# Patient Record
Sex: Female | Born: 1990 | Race: Black or African American | Hispanic: No | Marital: Single | Smoking: Never smoker
Health system: Southern US, Community
[De-identification: ages and names within clinical notes are randomized; demographics above are authoritative.]

## PROBLEM LIST (undated history)

## (undated) DIAGNOSIS — J40 Bronchitis, not specified as acute or chronic: Secondary | ICD-10-CM

## (undated) DIAGNOSIS — K859 Acute pancreatitis without necrosis or infection, unspecified: Secondary | ICD-10-CM

## (undated) HISTORY — PX: OVARIAN CYST SURGERY: SHX726

---

## 2012-02-22 ENCOUNTER — Emergency Department (HOSPITAL_COMMUNITY)
Admission: EM | Admit: 2012-02-22 | Discharge: 2012-02-22 | Disposition: A | Discharge status: Home / Self Care | Payer: Medicaid Other | Attending: Emergency Medicine | Admitting: Emergency Medicine

## 2012-02-22 ENCOUNTER — Emergency Department (HOSPITAL_COMMUNITY): Payer: Medicaid Other

## 2012-02-22 ENCOUNTER — Encounter (HOSPITAL_COMMUNITY): Payer: Self-pay | Admitting: *Deleted

## 2012-02-22 DIAGNOSIS — Z3202 Encounter for pregnancy test, result negative: Secondary | ICD-10-CM | POA: Insufficient documentation

## 2012-02-22 DIAGNOSIS — R079 Chest pain, unspecified: Secondary | ICD-10-CM | POA: Insufficient documentation

## 2012-02-22 DIAGNOSIS — J029 Acute pharyngitis, unspecified: Secondary | ICD-10-CM | POA: Insufficient documentation

## 2012-02-22 DIAGNOSIS — J069 Acute upper respiratory infection, unspecified: Secondary | ICD-10-CM

## 2012-02-22 LAB — URINE MICROSCOPIC-ADD ON

## 2012-02-22 LAB — URINALYSIS, ROUTINE W REFLEX MICROSCOPIC
Ketones, ur: 15 mg/dL — AB
Protein, ur: 100 mg/dL — AB
Urobilinogen, UA: 1 mg/dL (ref 0.0–1.0)

## 2012-02-22 MED ORDER — GUAIFENESIN ER 1200 MG PO TB12: 1.0000 | ORAL_TABLET | Freq: Two times a day (BID) | ORAL | Status: DC

## 2012-02-22 MED ORDER — IBUPROFEN 800 MG PO TABS: 800.0000 mg | ORAL_TABLET | Freq: Three times a day (TID) | ORAL | Status: DC | PRN

## 2012-02-22 MED ORDER — AMOXICILLIN 875 MG PO TABS: 875.0000 mg | ORAL_TABLET | Freq: Two times a day (BID) | ORAL | Status: DC

## 2012-02-22 NOTE — ED Notes (Signed)
Strep swallow screen done and being sent to lab at this time.

## 2012-02-22 NOTE — ED Notes (Signed)
To x-ray

## 2012-02-22 NOTE — ED Notes (Signed)
C/o flu like sx. Lists: sore throat, whole neck hurts, R ear ache, chest hurts, some dizziness & fever. Onset of sx 2d ago. Pt alert, NAD, calm, interactive, resps e/u, speaking in clear complete sentences. (denies: cough, congestion, cold sx or nvd), also reports heavy bleeding onset 3-4d ago. LMP 01/20/12. Took advil yesterday. Also tried chloraseptic spray. Last ate last night. Last BM yesterday.

## 2012-02-22 NOTE — ED Notes (Signed)
POCT-Preg was ran with a NEGATIVE (-) result.

## 2012-02-22 NOTE — ED Provider Notes (Signed)
History     CSN: [id number]  Arrival date & time 02/22/12  0540   First MD Initiated Contact with Patient 02/22/12 0559      Chief Complaint  Patient presents with  . Influenza    (Consider location/radiation/quality/duration/timing/severity/associated sxs/prior treatment) HPI The patient presents to the ER with sore throat, ear pain, and fever. The patient states that her symptoms started 3 days ago. The patient has had constant sharp knife like chest pain that will wax and wane in intensity. The patient states that she did not take anything prior to arrival for her symptoms. The patient denies SOB, headache, visual change, calf pain, weakness, dizziness, vomiting, nausea, abdominal pain, back pain, or syncope. The patient states that nothing seems to make her condition better or worse.  History reviewed. No pertinent past medical history.  Past Surgical History  Procedure Date  . Ovarian cyst surgery     No family history on file.  History  Substance Use Topics  . Smoking status: Never Smoker   . Smokeless tobacco: Not on file  . Alcohol Use: No    OB History    Grav Para Term Preterm Abortions TAB SAB Ect Mult Living                  Review of Systems All other systems negative except as documented in the HPI. All pertinent positives and negatives as reviewed in the HPI. Allergies  Review of patient's allergies indicates no known allergies.  Home Medications   Current Outpatient Rx  Name  Route  Sig  Dispense  Refill  . ACETAMINOPHEN 500 MG PO TABS   Oral   Take 1,000 mg by mouth every 6 (six) hours as needed. For pain           BP 130/71  Temp 100.9 F (38.3 C) (Oral)  Resp 18  SpO2 100%  LMP 02/19/2012  Physical Exam  Nursing note and vitals reviewed. Constitutional: She is oriented to person, place, and time. She appears well-developed and well-nourished.  Non-toxic appearance. She does not have a sickly appearance. She does not appear ill. No  distress.  HENT:  Head: Normocephalic and atraumatic.  Mouth/Throat: Uvula is midline and mucous membranes are normal. No uvula swelling. Posterior oropharyngeal erythema present. No oropharyngeal exudate.  Eyes: Pupils are equal, round, and reactive to light.  Neck: Normal range of motion. Neck supple.  Cardiovascular: Normal rate, regular rhythm and normal heart sounds.   No murmur heard. Pulmonary/Chest: Effort normal and breath sounds normal. No respiratory distress.  Abdominal: Soft. Bowel sounds are normal. She exhibits no distension. There is no tenderness.  Neurological: She is alert and oriented to person, place, and time.  Skin: Skin is warm and dry. No rash noted.    ED Course  Procedures (including critical care time)  Labs Reviewed  URINALYSIS, ROUTINE W REFLEX MICROSCOPIC - Abnormal; Notable for the following:    Color, Urine RED (*)  BIOCHEMICALS MAY BE AFFECTED BY COLOR   APPearance CLOUDY (*)     Hgb urine dipstick LARGE (*)     Bilirubin Urine SMALL (*)     Ketones, ur 15 (*)     Protein, ur 100 (*)     Leukocytes, UA SMALL (*)     All other components within normal limits  URINE MICROSCOPIC-ADD ON - Abnormal; Notable for the following:    Squamous Epithelial / LPF MANY (*)     Bacteria, UA MANY (*)  All other components within normal limits  POCT PREGNANCY, URINE  POCT PREGNANCY, URINE  RAPID STREP SCREEN  URINE CULTURE   06:15 AM Patient is advised of the expected course here in the ER. Asked if she had any questions at this time and she denied having any. Patient is stable at this time.    The patient will be discharged with flu like illness. The patient appears in no distress. The patient advised to rest and increase her fluid intake. Return here as needed. MDM          Carlyle Dolly, PA-C 02/24/12 [phone]

## 2012-02-22 NOTE — ED Notes (Signed)
ED PA at BS 

## 2012-02-23 LAB — URINE CULTURE

## 2012-02-25 NOTE — ED Provider Notes (Signed)
Medical screening examination/treatment/procedure(s) were performed by non-physician practitioner and as supervising physician I was immediately available for consultation/collaboration.   Jeymi Hepp, MD 02/25/12 0701 

## 2012-03-02 ENCOUNTER — Emergency Department (HOSPITAL_COMMUNITY): Payer: Medicaid Other

## 2012-03-02 ENCOUNTER — Emergency Department (HOSPITAL_COMMUNITY)
Admission: EM | Admit: 2012-03-02 | Discharge: 2012-03-02 | Disposition: A | Discharge status: Home / Self Care | Payer: Medicaid Other | Attending: Emergency Medicine | Admitting: Emergency Medicine

## 2012-03-02 ENCOUNTER — Encounter (HOSPITAL_COMMUNITY): Payer: Self-pay | Admitting: Emergency Medicine

## 2012-03-02 DIAGNOSIS — R5381 Other malaise: Secondary | ICD-10-CM | POA: Diagnosis not present

## 2012-03-02 DIAGNOSIS — R197 Diarrhea, unspecified: Secondary | ICD-10-CM | POA: Insufficient documentation

## 2012-03-02 DIAGNOSIS — R51 Headache: Secondary | ICD-10-CM | POA: Insufficient documentation

## 2012-03-02 DIAGNOSIS — Z8709 Personal history of other diseases of the respiratory system: Secondary | ICD-10-CM | POA: Insufficient documentation

## 2012-03-02 DIAGNOSIS — R110 Nausea: Secondary | ICD-10-CM | POA: Diagnosis not present

## 2012-03-02 DIAGNOSIS — M542 Cervicalgia: Secondary | ICD-10-CM | POA: Diagnosis not present

## 2012-03-02 DIAGNOSIS — K5900 Constipation, unspecified: Secondary | ICD-10-CM | POA: Diagnosis not present

## 2012-03-02 DIAGNOSIS — R42 Dizziness and giddiness: Secondary | ICD-10-CM | POA: Diagnosis not present

## 2012-03-02 DIAGNOSIS — N898 Other specified noninflammatory disorders of vagina: Secondary | ICD-10-CM | POA: Insufficient documentation

## 2012-03-02 DIAGNOSIS — R350 Frequency of micturition: Secondary | ICD-10-CM | POA: Insufficient documentation

## 2012-03-02 DIAGNOSIS — N739 Female pelvic inflammatory disease, unspecified: Secondary | ICD-10-CM

## 2012-03-02 DIAGNOSIS — R1032 Left lower quadrant pain: Secondary | ICD-10-CM | POA: Diagnosis present

## 2012-03-02 DIAGNOSIS — Z3202 Encounter for pregnancy test, result negative: Secondary | ICD-10-CM | POA: Insufficient documentation

## 2012-03-02 DIAGNOSIS — R509 Fever, unspecified: Secondary | ICD-10-CM | POA: Insufficient documentation

## 2012-03-02 DIAGNOSIS — R52 Pain, unspecified: Secondary | ICD-10-CM | POA: Insufficient documentation

## 2012-03-02 DIAGNOSIS — R3915 Urgency of urination: Secondary | ICD-10-CM | POA: Insufficient documentation

## 2012-03-02 HISTORY — DX: Bronchitis, not specified as acute or chronic: J40

## 2012-03-02 LAB — CBC WITH DIFFERENTIAL/PLATELET
Basophils Relative: 0 % (ref 0–1)
Eosinophils Absolute: 0.1 10*3/uL (ref 0.0–0.7)
Eosinophils Relative: 1 % (ref 0–5)
Hemoglobin: 12.1 g/dL (ref 12.0–15.0)
MCH: 26.4 pg (ref 26.0–34.0)
MCHC: 33.6 g/dL (ref 30.0–36.0)
MCV: 78.6 fL (ref 78.0–100.0)
Monocytes Relative: 9 % (ref 3–12)
Neutrophils Relative %: 81 % — ABNORMAL HIGH (ref 43–77)

## 2012-03-02 LAB — COMPREHENSIVE METABOLIC PANEL
ALT: 8 U/L (ref 0–35)
BUN: 9 mg/dL (ref 6–23)
CO2: 26 mEq/L (ref 19–32)
Calcium: 9.1 mg/dL (ref 8.4–10.5)
Creatinine, Ser: 0.73 mg/dL (ref 0.50–1.10)
GFR calc Af Amer: >90 mL/min (ref >90)
GFR calc non Af Amer: >90 mL/min (ref >90)
Glucose, Bld: 88 mg/dL (ref 70–99)
Sodium: 139 mEq/L (ref 135–145)
Total Protein: 7.8 g/dL (ref 6.0–8.3)

## 2012-03-02 LAB — WET PREP, GENITAL: Yeast Wet Prep HPF POC: NONE SEEN

## 2012-03-02 LAB — URINALYSIS, ROUTINE W REFLEX MICROSCOPIC
Bilirubin Urine: NEGATIVE
Hgb urine dipstick: NEGATIVE
Nitrite: NEGATIVE
Specific Gravity, Urine: 1.014 (ref 1.005–1.030)
pH: 6.5 (ref 5.0–8.0)

## 2012-03-02 LAB — POCT I-STAT, CHEM 8
BUN: 8 mg/dL (ref 6–23)
Chloride: 104 mEq/L (ref 96–112)
HCT: 38 % (ref 36.0–46.0)
Potassium: 3.5 mEq/L (ref 3.5–5.1)
Sodium: 141 mEq/L (ref 135–145)

## 2012-03-02 MED ORDER — DOXYCYCLINE HYCLATE 100 MG PO CAPS: 100.0000 mg | ORAL_CAPSULE | Freq: Two times a day (BID) | ORAL | Status: DC

## 2012-03-02 MED ORDER — SODIUM CHLORIDE 0.9 % IV BOLUS (SEPSIS)
1000.0000 mL | Freq: Once | INTRAVENOUS | Status: AC
  Administered 2012-03-02: 1000 mL via INTRAVENOUS

## 2012-03-02 MED ORDER — OXYCODONE-ACETAMINOPHEN 5-325 MG PO TABS: 1.0000 | ORAL_TABLET | ORAL | Status: DC | PRN

## 2012-03-02 MED ORDER — LIDOCAINE HCL (PF) 1 % IJ SOLN
INTRAMUSCULAR | Status: AC
  Administered 2012-03-02: 1.8 mL via INTRAMUSCULAR
  Filled 2012-03-02: qty 5

## 2012-03-02 MED ORDER — CEFTRIAXONE SODIUM 250 MG IJ SOLR
250.0000 mg | Freq: Once | INTRAMUSCULAR | Status: AC
  Administered 2012-03-02: 250 mg via INTRAMUSCULAR
  Filled 2012-03-02: qty 250

## 2012-03-02 MED ORDER — ONDANSETRON 4 MG PO TBDP
4.0000 mg | ORAL_TABLET | Freq: Once | ORAL | Status: AC
  Administered 2012-03-02: 4 mg via ORAL
  Filled 2012-03-02: qty 1

## 2012-03-02 MED ORDER — IOHEXOL 300 MG/ML  SOLN
20.0000 mL | INTRAMUSCULAR | Status: AC
  Administered 2012-03-02: 25 mL via ORAL

## 2012-03-02 MED ORDER — ACETAMINOPHEN 325 MG PO TABS
650.0000 mg | ORAL_TABLET | Freq: Once | ORAL | Status: AC
  Administered 2012-03-02: 650 mg via ORAL
  Filled 2012-03-02: qty 2

## 2012-03-02 MED ORDER — IOHEXOL 300 MG/ML  SOLN
100.0000 mL | Freq: Once | INTRAMUSCULAR | Status: AC | PRN
  Administered 2012-03-02: 100 mL via INTRAVENOUS

## 2012-03-02 MED ORDER — METRONIDAZOLE 500 MG PO TABS: 500.0000 mg | ORAL_TABLET | Freq: Two times a day (BID) | ORAL | Status: DC

## 2012-03-02 NOTE — ED Provider Notes (Signed)
History     CSN: [id number]  Arrival date & time 03/02/12  4098   First MD Initiated Contact with Patient 03/02/12 [phone]      Chief Complaint  Patient presents with  . Abdominal Pain  . Headache    (Consider location/radiation/quality/duration/timing/severity/associated sxs/prior treatment) HPI Comments: 22 y.o. female presents today complaining of lingering flu-like symptoms: chills, headache, nausea, dizziness, generalized body aches, diarrhea now somewhat constipated. Pt also complaining of severe, dull, LLQ abdominal pain for the past 2 days. Gradually worsening. Persistent. Nothing making it better or worse. Pt was treated here about a week ago for a URI with amoxicillin. Boyfriend had similar symptoms a few weeks ago. Pt also complaining of burning on urination, discharge, and retention. PMHx significant for prior ovarian cysts removed last April.     Past Medical History  Diagnosis Date  . Bronchitis     Past Surgical History  Procedure Laterality Date  . Ovarian cyst surgery      No family history on file.  History  Substance Use Topics  . Smoking status: Never Smoker   . Smokeless tobacco: Not on file  . Alcohol Use: No    OB History   Grav Para Term Preterm Abortions TAB SAB Ect Mult Living                  Review of Systems  Constitutional: Positive for fever and chills. Negative for diaphoresis.  HENT: Positive for neck pain. Negative for ear pain, congestion, sore throat, rhinorrhea, sneezing, neck stiffness and sinus pressure.   Eyes: Negative for visual disturbance.  Respiratory: Negative for apnea, chest tightness and shortness of breath.   Cardiovascular: Negative for chest pain and palpitations.  Gastrointestinal: Positive for nausea, abdominal pain, diarrhea and constipation. Negative for vomiting and abdominal distention.       LLQ pain  Genitourinary: Positive for urgency, frequency and vaginal discharge. Negative for dysuria and hematuria.   Musculoskeletal: Negative for gait problem.  Skin: Negative for rash.  Neurological: Positive for dizziness, weakness, light-headedness and headaches. Negative for syncope and numbness.    Allergies  Penicillins  Home Medications   Current Outpatient Rx  Name  Route  Sig  Dispense  Refill  . acetaminophen (TYLENOL) 500 MG tablet   Oral   Take 1,000 mg by mouth every 6 (six) hours as needed. For pain         . amoxicillin (AMOXIL) 875 MG tablet   Oral   Take 1 tablet (875 mg total) by mouth 2 (two) times daily.   20 tablet   0   . Guaifenesin 1200 MG TB12   Oral   Take 1 tablet (1,200 mg total) by mouth 2 (two) times daily.   20 each   0   . ibuprofen (ADVIL,MOTRIN) 800 MG tablet   Oral   Take 1 tablet (800 mg total) by mouth every 8 (eight) hours as needed for pain.   21 tablet   0     BP 128/72  Pulse 111  Temp(Src) 100.5 F (38.1 C) (Oral)  Resp 20  SpO2 100%  LMP 02/19/2012  Physical Exam  Nursing note and vitals reviewed. Constitutional: She is oriented to person, place, and time. She appears well-developed and well-nourished.  Pt is sick looking and uncomfortable  HENT:  Head: Normocephalic and atraumatic.  Right Ear: Tympanic membrane normal.  Left Ear: Tympanic membrane normal.  Eyes: Conjunctivae and EOM are normal. Pupils are equal, round, and reactive to  light.  Neck: Normal range of motion. Neck supple.  No meningeal signs  Cardiovascular: Normal rate, regular rhythm and normal heart sounds.  Exam reveals no gallop and no friction rub.   No murmur heard. Pulmonary/Chest: Effort normal and breath sounds normal. No respiratory distress. She has no wheezes. She has no rales. She exhibits no tenderness.  Abdominal: Soft. Bowel sounds are normal. She exhibits no distension. There is tenderness. There is no rebound and no guarding.  LLQ tenderness on palpation.  Genitourinary: Vagina normal.  Pt expressed discomfort during pelvic exam. Adnexal  tenderness left > right. Mild cervical motion tenderness. White cervical discharge.   Musculoskeletal: Normal range of motion. She exhibits no edema and no tenderness.  5/5 strength throughout.  Neurological: She is alert and oriented to person, place, and time. No cranial nerve deficit.  No focal deficits. Sensitivity to light touch intact.  Skin: Skin is warm and dry. She is not diaphoretic. No erythema. There is pallor.    ED Course  Procedures (including critical care time)  Labs Reviewed  GC/CHLAMYDIA PROBE AMP  WET PREP, GENITAL  URINALYSIS, ROUTINE W REFLEX MICROSCOPIC  PREGNANCY, URINE  CBC WITH DIFFERENTIAL  LIPASE, BLOOD  COMPREHENSIVE METABOLIC PANEL   Results for orders placed during the hospital encounter of 03/02/12  WET PREP, GENITAL      Result Value Range   Yeast Wet Prep HPF POC NONE SEEN  NONE SEEN   Trich, Wet Prep NONE SEEN  NONE SEEN   Clue Cells Wet Prep HPF POC FEW (*) NONE SEEN   WBC, Wet Prep HPF POC MANY (*) NONE SEEN  URINALYSIS, ROUTINE W REFLEX MICROSCOPIC      Result Value Range   Color, Urine YELLOW  YELLOW   APPearance CLEAR  CLEAR   Specific Gravity, Urine 1.014  1.005 - 1.030   pH 6.5  5.0 - 8.0   Glucose, UA NEGATIVE  NEGATIVE mg/dL   Hgb urine dipstick NEGATIVE  NEGATIVE   Bilirubin Urine NEGATIVE  NEGATIVE   Ketones, ur NEGATIVE  NEGATIVE mg/dL   Protein, ur NEGATIVE  NEGATIVE mg/dL   Urobilinogen, UA 1.0  0.0 - 1.0 mg/dL   Nitrite NEGATIVE  NEGATIVE   Leukocytes, UA NEGATIVE  NEGATIVE  PREGNANCY, URINE      Result Value Range   Preg Test, Ur NEGATIVE  NEGATIVE  CBC WITH DIFFERENTIAL      Result Value Range   WBC 15.6 (*) 4.0 - 10.5 K/uL   RBC 4.58  3.87 - 5.11 MIL/uL   Hemoglobin 12.1  12.0 - 15.0 g/dL   HCT 01.0  27.2 - 53.6 %   MCV 78.6  78.0 - 100.0 fL   MCH 26.4  26.0 - 34.0 pg   MCHC 33.6  30.0 - 36.0 g/dL   RDW 64.4  03.4 - 74.2 %   Platelets 366  150 - 400 K/uL   Neutrophils Relative 81 (*) 43 - 77 %   Neutro Abs  12.7 (*) 1.7 - 7.7 K/uL   Lymphocytes Relative 9 (*) 12 - 46 %   Lymphs Abs 1.4  0.7 - 4.0 K/uL   Monocytes Relative 9  3 - 12 %   Monocytes Absolute 1.4 (*) 0.1 - 1.0 K/uL   Eosinophils Relative 1  0 - 5 %   Eosinophils Absolute 0.1  0.0 - 0.7 K/uL   Basophils Relative 0  0 - 1 %   Basophils Absolute 0.0  0.0 - 0.1 K/uL  LIPASE, BLOOD      Result Value Range   Lipase 1888 (*) 11 - 59 U/L  COMPREHENSIVE METABOLIC PANEL      Result Value Range   Sodium 139  135 - 145 mEq/L   Potassium 3.5  3.5 - 5.1 mEq/L   Chloride 102  96 - 112 mEq/L   CO2 26  19 - 32 mEq/L   Glucose, Bld 88  70 - 99 mg/dL   BUN 9  6 - 23 mg/dL   Creatinine, Ser 5.78  0.50 - 1.10 mg/dL   Calcium 9.1  8.4 - 46.9 mg/dL   Total Protein 7.8  6.0 - 8.3 g/dL   Albumin 3.6  3.5 - 5.2 g/dL   AST 15  0 - 37 U/L   ALT 8  0 - 35 U/L   Alkaline Phosphatase 58  39 - 117 U/L   Total Bilirubin 0.2 (*) 0.3 - 1.2 mg/dL   GFR calc non Af Amer >90  >90 mL/min   GFR calc Af Amer >90  >90 mL/min  LACTIC ACID, PLASMA      Result Value Range   Lactic Acid, Venous 0.5  0.5 - 2.2 mmol/L  POCT I-STAT, CHEM 8      Result Value Range   Sodium 141  135 - 145 mEq/L   Potassium 3.5  3.5 - 5.1 mEq/L   Chloride 104  96 - 112 mEq/L   BUN 8  6 - 23 mg/dL   Creatinine, Ser 6.29  0.50 - 1.10 mg/dL   Glucose, Bld 92  70 - 99 mg/dL   Calcium, Ion 5.28 (*) 1.12 - 1.23 mmol/L   TCO2 27  0 - 100 mmol/L   Hemoglobin 12.9  12.0 - 15.0 g/dL   HCT 41.3  24.4 - 01.0 %   US Transvaginal Non-ob  03/02/2012  *RADIOLOGY REPORT*  Clinical Data:  Severe pelvic pain, left side greater than right. The clinical suspicion for ovarian torsion.  Previous ovarian cyst removal.  TRANSABDOMINAL AND TRANSVAGINAL ULTRASOUND OF PELVIS DOPPLER ULTRASOUND OF OVARIES  Technique:  Both transabdominal and transvaginal ultrasound examinations of the pelvis were performed. Transabdominal technique was performed for global imaging of the pelvis including uterus, ovaries,  adnexal regions, and pelvic cul-de-sac.  It was necessary to proceed with endovaginal exam following the transabdominal exam to visualize the retroverted uterus and ovaries.  Color and duplex Doppler ultrasound was utilized to evaluate blood flow to the ovaries.  Comparison:  None.  Findings:  Uterus:  6.8 x 3.6 x 4.6 cm.  Retroverted.  No fibroids are other uterine mass identified.  Endometrium:  Double layer thickness measures 5 mm transvaginally. No focal lesion visualized.  Right ovary: 3.2 x 2.3 x 1.9 cm.  A 1 cm hyperechoic lesion is seen in the ovary which does not show acoustic shadowing.  This could represent a small dermoid versus a resolving corpus luteum/albicans.  Left ovary:   3.6 x 1.6 x 1.4 cm.  Normal appearance.  No adnexal mass identified.  Other:  A small amount of free fluid is seen in the pelvic cul-de- sac and both adnexa.  Pulsed Doppler evaluation demonstrates normal low-resistance arterial and venous waveforms in both ovaries.  IMPRESSION:  1.  1 cm hyperechoic lesion in the right ovary, which could represent a small dermoid versus a resolving corpus luteum. Recommend follow-up by pelvic ultrasound in 6-12 weeks. 2.  Small amount of free fluid. 3.  Normal appearance of uterus and left  ovary. 4.  No sonographic evidence for ovarian torsion.   Original Report Authenticated By: Myles Rosenthal, M.D.    US Pelvis Complete  03/02/2012  *RADIOLOGY REPORT*  Clinical Data:  Severe pelvic pain, left side greater than right. The clinical suspicion for ovarian torsion.  Previous ovarian cyst removal.  TRANSABDOMINAL AND TRANSVAGINAL ULTRASOUND OF PELVIS DOPPLER ULTRASOUND OF OVARIES  Technique:  Both transabdominal and transvaginal ultrasound examinations of the pelvis were performed. Transabdominal technique was performed for global imaging of the pelvis including uterus, ovaries, adnexal regions, and pelvic cul-de-sac.  It was necessary to proceed with endovaginal exam following the transabdominal  exam to visualize the retroverted uterus and ovaries.  Color and duplex Doppler ultrasound was utilized to evaluate blood flow to the ovaries.  Comparison:  None.  Findings:  Uterus:  6.8 x 3.6 x 4.6 cm.  Retroverted.  No fibroids are other uterine mass identified.  Endometrium:  Double layer thickness measures 5 mm transvaginally. No focal lesion visualized.  Right ovary: 3.2 x 2.3 x 1.9 cm.  A 1 cm hyperechoic lesion is seen in the ovary which does not show acoustic shadowing.  This could represent a small dermoid versus a resolving corpus luteum/albicans.  Left ovary:   3.6 x 1.6 x 1.4 cm.  Normal appearance.  No adnexal mass identified.  Other:  A small amount of free fluid is seen in the pelvic cul-de- sac and both adnexa.  Pulsed Doppler evaluation demonstrates normal low-resistance arterial and venous waveforms in both ovaries.  IMPRESSION:  1.  1 cm hyperechoic lesion in the right ovary, which could represent a small dermoid versus a resolving corpus luteum. Recommend follow-up by pelvic ultrasound in 6-12 weeks. 2.  Small amount of free fluid. 3.  Normal appearance of uterus and left ovary. 4.  No sonographic evidence for ovarian torsion.   Original Report Authenticated By: Myles Rosenthal, M.D.    Ct Abdomen Pelvis W Contrast  03/02/2012  *RADIOLOGY REPORT*  Clinical Data: Diffuse abdominal pain, nausea, fever  CT ABDOMEN AND PELVIS WITH CONTRAST  Technique:  Multidetector CT imaging of the abdomen and pelvis was performed following the standard protocol during bolus administration of intravenous contrast.  Contrast: OMNIPAQUE IOHEXOL 300 MG/ML  SOLN  Comparison: 03/02/2012 pelvic ultrasound  Findings: Trace dependent left effusion noted with minimal streaky left base atelectasis.  Right lung base clear.  Normal heart size. No pericardial effusion.  Abdomen:  Incidental tiny hypodense 5 mm cyst in the right hepatic lobe posteriorly, image 13.  No other hepatic abnormality.  No biliary dilatation.   Hepatic and portal veins are patent. Gallbladder, biliary system, pancreas, spleen, adrenal glands, and kidneys are within normal limits and demonstrate no acute process for age.  Negative for bowel obstruction, dilatation, ileus pattern, or free air.  No abdominal free fluid, fluid collection, hemorrhage, abscess, or adenopathy.  Pelvis:  Normal appendix in the right lower quadrant.  No acute distal bowel process.  Small to moderate amount of free pelvic fluid surrounding the uterus and adnexa.  Indistinctness of the ovaries bilaterally by CT.  These are better appreciated by ultrasound earlier today.  Small fat density lesion in the left adnexa suspicious for an incidental ovarian dermoid roughly measuring 12 mm, image 69.  Normal sized uterus.  Urinary bladder unremarkable.  No inguinal abnormality or hernia.  IMPRESSION: Trace left effusion and left base atelectasis.  Incidental 5 mm right hepatic cyst  Normal appendix  No acute abdominal process  Small to  moderate amount of pelvic free fluid, nonspecific.  12 mm left ovarian fat density lesion compatible with an incidental small ovarian dermoid.   Original Report Authenticated By: Judie Petit. Miles Costain, M.D.    [place] Art/ven Flow Abd Pelv Doppler Limited  03/02/2012  *RADIOLOGY REPORT*  Clinical Data:  Severe pelvic pain, left side greater than right. The clinical suspicion for ovarian torsion.  Previous ovarian cyst removal.  TRANSABDOMINAL AND TRANSVAGINAL ULTRASOUND OF PELVIS DOPPLER ULTRASOUND OF OVARIES  Technique:  Both transabdominal and transvaginal ultrasound examinations of the pelvis were performed. Transabdominal technique was performed for global imaging of the pelvis including uterus, ovaries, adnexal regions, and pelvic cul-de-sac.  It was necessary to proceed with endovaginal exam following the transabdominal exam to visualize the retroverted uterus and ovaries.  Color and duplex Doppler ultrasound was utilized to evaluate blood flow to the ovaries.   Comparison:  None.  Findings:  Uterus:  6.8 x 3.6 x 4.6 cm.  Retroverted.  No fibroids are other uterine mass identified.  Endometrium:  Double layer thickness measures 5 mm transvaginally. No focal lesion visualized.  Right ovary: 3.2 x 2.3 x 1.9 cm.  A 1 cm hyperechoic lesion is seen in the ovary which does not show acoustic shadowing.  This could represent a small dermoid versus a resolving corpus luteum/albicans.  Left ovary:   3.6 x 1.6 x 1.4 cm.  Normal appearance.  No adnexal mass identified.  Other:  A small amount of free fluid is seen in the pelvic cul-de- sac and both adnexa.  Pulsed Doppler evaluation demonstrates normal low-resistance arterial and venous waveforms in both ovaries.  IMPRESSION:  1.  1 cm hyperechoic lesion in the right ovary, which could represent a small dermoid versus a resolving corpus luteum. Recommend follow-up by pelvic ultrasound in 6-12 weeks. 2.  Small amount of free fluid. 3.  Normal appearance of uterus and left ovary. 4.  No sonographic evidence for ovarian torsion.   Original Report Authenticated By: Myles Rosenthal, M.D.    No results found.   Diagnosis: PID, BV   MDM  Labs show elevated WBC (15.6) and lipase (1888), but imaging and history do not support diagnosis of pancreatitis. Complaining of LLQ tenderness on PE and pelvic exam. Pelvic exam not clear cut to rule out PID. Mild CMT.  Bilateral pelvic pain left > right. [place] and CT show no torsion, incidental findings of dermoid vs corpus luteum. Clue cells found on wet prep. Will treat for PID and BV. Pt feeling significantly better after zofran and IVF and ready to go home.  At this time there does not appear to be any evidence of an acute emergency medical condition and the patient appears stable for discharge with appropriate outpatient follow up. Diagnosis, importance of follow up, compliance with medication, and abstinence from alcohol during medication course was discussed with patient who verbalizes  understanding and is agreeable to discharge. Resource guide provided. Pt followed by Dr. Bebe Shaggy as well who agreed with plan to discharge with outpatient follow up.   Glade Nurse, PA-C 03/02/12 2159

## 2012-03-02 NOTE — ED Provider Notes (Signed)
Pt seen with PA Pt is here for abdominal pain, fever and myalgias She has diffuse lower abdominal tenderness Labs currently pending, but may need [place] or CT imaging for further guidance  Joya Gaskins, MD 03/02/12 [phone]

## 2012-03-02 NOTE — ED Notes (Signed)
Pt reports abdominal pain for 2 days with nausea, malodorous abnormal vaginal discharge and burning with urination. Also reports dizziness and headache as well as neck stiffness. States she was tx approx 1 week ago for URI that had gotten worse. Pt is a&ox4. Skin warm and dry.

## 2012-03-02 NOTE — ED Notes (Signed)
Pt having nausea no vomiting.

## 2012-03-02 NOTE — ED Notes (Signed)
Family at bedside. 

## 2012-03-02 NOTE — ED Notes (Addendum)
Patient advised too hard to drink contrast and may not be able to drink.  "It doesn't taste good".

## 2012-03-03 LAB — GC/CHLAMYDIA PROBE AMP
CT Probe RNA: NEGATIVE
GC Probe RNA: NEGATIVE

## 2012-03-03 NOTE — ED Provider Notes (Signed)
Medical screening examination/treatment/procedure(s) were conducted as a shared visit with non-physician practitioner(s) and myself.  I personally evaluated the patient during the encounter  Pt with unclear history as had RLQ tenderness on my exam, but PA noted LLQ tenderness.  She also had elevated lipase but no signs of pancreatitis by CT imaging.  After OBS in the ED pt was much improved, felt well and she requested d/c home.  I discussed strict return precautions with patient.  She will be treated for PID just in case this is playing a role, however she reports monogamous relationship and no concern for STD  Joya Gaskins, MD 03/03/12 0700

## 2012-03-07 ENCOUNTER — Telehealth (HOSPITAL_COMMUNITY): Payer: Self-pay | Admitting: Emergency Medicine

## 2012-05-05 ENCOUNTER — Emergency Department (HOSPITAL_COMMUNITY)
Admission: EM | Admit: 2012-05-05 | Discharge: 2012-05-05 | Disposition: A | Discharge status: Home / Self Care | Payer: Medicaid Other | Attending: Emergency Medicine | Admitting: Emergency Medicine

## 2012-05-05 ENCOUNTER — Encounter (HOSPITAL_COMMUNITY): Payer: Self-pay | Admitting: Emergency Medicine

## 2012-05-05 DIAGNOSIS — Z3202 Encounter for pregnancy test, result negative: Secondary | ICD-10-CM | POA: Insufficient documentation

## 2012-05-05 DIAGNOSIS — R109 Unspecified abdominal pain: Secondary | ICD-10-CM

## 2012-05-05 DIAGNOSIS — R1013 Epigastric pain: Secondary | ICD-10-CM | POA: Insufficient documentation

## 2012-05-05 DIAGNOSIS — Z8719 Personal history of other diseases of the digestive system: Secondary | ICD-10-CM | POA: Insufficient documentation

## 2012-05-05 DIAGNOSIS — Z79899 Other long term (current) drug therapy: Secondary | ICD-10-CM | POA: Insufficient documentation

## 2012-05-05 DIAGNOSIS — R110 Nausea: Secondary | ICD-10-CM | POA: Insufficient documentation

## 2012-05-05 DIAGNOSIS — Z8709 Personal history of other diseases of the respiratory system: Secondary | ICD-10-CM | POA: Insufficient documentation

## 2012-05-05 HISTORY — DX: Acute pancreatitis without necrosis or infection, unspecified: K85.90

## 2012-05-05 LAB — URINALYSIS, ROUTINE W REFLEX MICROSCOPIC
Glucose, UA: NEGATIVE mg/dL
Nitrite: NEGATIVE
Specific Gravity, Urine: 1.01 (ref 1.005–1.030)
pH: 5.5 (ref 5.0–8.0)

## 2012-05-05 LAB — COMPREHENSIVE METABOLIC PANEL
ALT: 8 U/L (ref 0–35)
Calcium: 9.6 mg/dL (ref 8.4–10.5)
Creatinine, Ser: 1.01 mg/dL (ref 0.50–1.10)
GFR calc Af Amer: >90 mL/min (ref >90)
GFR calc non Af Amer: 80 mL/min — ABNORMAL LOW (ref >90)
Glucose, Bld: 98 mg/dL (ref 70–99)
Sodium: 143 mEq/L (ref 135–145)
Total Protein: 7.5 g/dL (ref 6.0–8.3)

## 2012-05-05 LAB — POCT PREGNANCY, URINE: Preg Test, Ur: NEGATIVE

## 2012-05-05 LAB — CBC WITH DIFFERENTIAL/PLATELET
Basophils Absolute: 0.1 10*3/uL (ref 0.0–0.1)
Eosinophils Absolute: 0.2 10*3/uL (ref 0.0–0.7)
Eosinophils Relative: 2 % (ref 0–5)
HCT: 37.4 % (ref 36.0–46.0)
Lymphs Abs: 2.4 10*3/uL (ref 0.7–4.0)
MCH: 25.9 pg — ABNORMAL LOW (ref 26.0–34.0)
MCV: 77 fL — ABNORMAL LOW (ref 78.0–100.0)
Monocytes Absolute: 1.2 10*3/uL — ABNORMAL HIGH (ref 0.1–1.0)
Platelets: 393 10*3/uL (ref 150–400)
RDW: 14.6 % (ref 11.5–15.5)

## 2012-05-05 LAB — URINE MICROSCOPIC-ADD ON

## 2012-05-05 MED ORDER — PANTOPRAZOLE SODIUM 40 MG IV SOLR
40.0000 mg | Freq: Once | INTRAVENOUS | Status: AC
  Administered 2012-05-05: 40 mg via INTRAVENOUS
  Filled 2012-05-05: qty 40

## 2012-05-05 MED ORDER — GI COCKTAIL ~~LOC~~
30.0000 mL | Freq: Once | ORAL | Status: AC
  Administered 2012-05-05: 30 mL via ORAL
  Filled 2012-05-05: qty 30

## 2012-05-05 MED ORDER — MORPHINE SULFATE 4 MG/ML IJ SOLN
4.0000 mg | Freq: Once | INTRAMUSCULAR | Status: AC
  Administered 2012-05-05: 4 mg via INTRAVENOUS
  Filled 2012-05-05: qty 1

## 2012-05-05 MED ORDER — ONDANSETRON HCL 4 MG/2ML IJ SOLN
4.0000 mg | Freq: Once | INTRAMUSCULAR | Status: AC
  Administered 2012-05-05: 4 mg via INTRAVENOUS
  Filled 2012-05-05: qty 2

## 2012-05-05 MED ORDER — ONDANSETRON 4 MG PO TBDP: 4.0000 mg | ORAL_TABLET | Freq: Three times a day (TID) | ORAL | Status: DC | PRN

## 2012-05-05 MED ORDER — SODIUM CHLORIDE 0.9 % IV BOLUS (SEPSIS)
1000.0000 mL | Freq: Once | INTRAVENOUS | Status: AC
  Administered 2012-05-05: 1000 mL via INTRAVENOUS

## 2012-05-05 MED ORDER — HYDROCODONE-ACETAMINOPHEN 5-325 MG PO TABS: 2.0000 | ORAL_TABLET | ORAL | Status: DC | PRN

## 2012-05-05 NOTE — ED Provider Notes (Signed)
Medical screening examination/treatment/procedure(s) were performed by non-physician practitioner and as supervising physician I was immediately available for consultation/collaboration.  Reford Olliff, MD 05/05/12 1621 

## 2012-05-05 NOTE — ED Notes (Addendum)
Pt was seen 2 months ago and told she had pancreatitis and was told to stop eating fast foods. She was also told at the time that her birth control implant in her arm could be related also and to minimize any drinking. She only drank rarely at the time. She had been seen in the weeks before for PID and was placed on Vicodin and 2 ATB. Doesn't recall names.

## 2012-05-05 NOTE — ED Provider Notes (Signed)
History     CSN: [id number]  Arrival date & time 05/05/12  0554   First MD Initiated Contact with Patient 05/05/12 [phone]      Chief Complaint  Patient presents with  . Abdominal Pain  . Nausea    (Consider location/radiation/quality/duration/timing/severity/associated sxs/prior treatment) HPI Comments: Patient is a 22 year old female who presents with abdominal pain starting last night. The pain is located in her epigastrium and does not radiate. The pain is described as aching and severe. The pain started gradually and progressively worsened since the onset. No alleviating/aggravating factors. The patient has tried nothing for symptoms without relief. Associated symptoms include nausea. Patient denies fever, headache, vomiting, diarrhea, chest pain, SOB, dysuria, constipation, abnormal vaginal bleeding/discharge. Patient reports having the same symptoms 2 months ago when she was told she had pancreatitis. Patient was told it is likely due to her birth control. She has followed up with her OBGYN who did not make any changes to her birth control.     Patient is a 22 y.o. female presenting with abdominal pain.  Abdominal Pain Associated symptoms: nausea     Past Medical History  Diagnosis Date  . Bronchitis   . Pancreatitis     Past Surgical History  Procedure Laterality Date  . Ovarian cyst surgery      No family history on file.  History  Substance Use Topics  . Smoking status: Never Smoker   . Smokeless tobacco: Not on file  . Alcohol Use: No    OB History   Grav Para Term Preterm Abortions TAB SAB Ect Mult Living                  Review of Systems  Gastrointestinal: Positive for nausea and abdominal pain.  All other systems reviewed and are negative.    Allergies  Penicillins  Home Medications   Current Outpatient Rx  Name  Route  Sig  Dispense  Refill  . etonogestrel (IMPLANON) 68 MG IMPL implant   Subcutaneous   Inject 1 each into the skin once.          BP 126/82  Pulse 93  Temp(Src) 98 F (36.7 C) (Oral)  Resp 16  SpO2 100%  Physical Exam  Nursing note and vitals reviewed. Constitutional: She is oriented to person, place, and time. She appears well-developed and well-nourished. No distress.  HENT:  Head: Normocephalic and atraumatic.  Eyes: Conjunctivae and EOM are normal. Pupils are equal, round, and reactive to light.  Neck: Normal range of motion.  Cardiovascular: Normal rate and regular rhythm.  Exam reveals no gallop and no friction rub.   No murmur heard. Pulmonary/Chest: Effort normal and breath sounds normal. She has no wheezes. She has no rales. She exhibits no tenderness.  Abdominal: Soft. She exhibits no distension. There is tenderness. There is no rebound and no guarding.  Epigastric tenderness to palpation.   Musculoskeletal: Normal range of motion.  Neurological: She is alert and oriented to person, place, and time. Coordination normal.  Speech is goal-oriented. Moves limbs without ataxia.   Skin: Skin is warm and dry.  Psychiatric: She has a normal mood and affect. Her behavior is normal.    ED Course  Procedures (including critical care time)  Labs Reviewed  URINALYSIS, ROUTINE W REFLEX MICROSCOPIC - Abnormal; Notable for the following:    Hgb urine dipstick MODERATE (*)    Leukocytes, UA MODERATE (*)    All other components within normal limits  CBC WITH  DIFFERENTIAL - Abnormal; Notable for the following:    WBC 11.9 (*)    MCV 77.0 (*)    MCH 25.9 (*)    Neutro Abs 8.0 (*)    Monocytes Absolute 1.2 (*)    All other components within normal limits  COMPREHENSIVE METABOLIC PANEL - Abnormal; Notable for the following:    Total Bilirubin 0.2 (*)    GFR calc non Af Amer 80 (*)    All other components within normal limits  URINE MICROSCOPIC-ADD ON - Abnormal; Notable for the following:    Bacteria, UA FEW (*)    All other components within normal limits  URINE CULTURE  LIPASE, BLOOD  POCT  PREGNANCY, URINE   No results found.   1. Abdominal pain       MDM  7:14 AM Labs pending. Patient will have morphine and zofran for pain.   8:42 AM Labs unremarkable. Urine pregnancy negative. Urinalysis shows UTI. Lipase not elevate as it previously was. Vitals stable and patient afebrile. Patient likely experiencing gastritis. I will treat her accordingly with IV protonix and GI cocktail and have her discharged with PCP follow up.     Emilia Beck, [place] 05/05/12 [phone]

## 2012-05-05 NOTE — ED Notes (Signed)
Patient is resting comfortably. 

## 2012-05-06 LAB — URINE CULTURE: Colony Count: NO GROWTH

## 2012-05-10 ENCOUNTER — Encounter (HOSPITAL_COMMUNITY): Payer: Self-pay | Admitting: *Deleted

## 2012-05-10 ENCOUNTER — Emergency Department (HOSPITAL_COMMUNITY)
Admission: EM | Admit: 2012-05-10 | Discharge: 2012-05-10 | Disposition: A | Discharge status: Home / Self Care | Payer: Medicaid Other | Attending: Emergency Medicine | Admitting: Emergency Medicine

## 2012-05-10 DIAGNOSIS — R109 Unspecified abdominal pain: Secondary | ICD-10-CM | POA: Insufficient documentation

## 2012-05-10 DIAGNOSIS — K2990 Gastroduodenitis, unspecified, without bleeding: Secondary | ICD-10-CM | POA: Insufficient documentation

## 2012-05-10 DIAGNOSIS — Z8709 Personal history of other diseases of the respiratory system: Secondary | ICD-10-CM | POA: Insufficient documentation

## 2012-05-10 DIAGNOSIS — M545 Low back pain, unspecified: Secondary | ICD-10-CM | POA: Insufficient documentation

## 2012-05-10 DIAGNOSIS — R0602 Shortness of breath: Secondary | ICD-10-CM | POA: Insufficient documentation

## 2012-05-10 DIAGNOSIS — K2970 Gastritis, unspecified, without bleeding: Secondary | ICD-10-CM | POA: Insufficient documentation

## 2012-05-10 DIAGNOSIS — Z3202 Encounter for pregnancy test, result negative: Secondary | ICD-10-CM | POA: Insufficient documentation

## 2012-05-10 DIAGNOSIS — Z79899 Other long term (current) drug therapy: Secondary | ICD-10-CM | POA: Insufficient documentation

## 2012-05-10 DIAGNOSIS — Z8719 Personal history of other diseases of the digestive system: Secondary | ICD-10-CM | POA: Insufficient documentation

## 2012-05-10 DIAGNOSIS — R10819 Abdominal tenderness, unspecified site: Secondary | ICD-10-CM | POA: Insufficient documentation

## 2012-05-10 LAB — CBC WITH DIFFERENTIAL/PLATELET
Hemoglobin: 13.5 g/dL (ref 12.0–15.0)
Lymphs Abs: 1.7 10*3/uL (ref 0.7–4.0)
Monocytes Relative: 7 % (ref 3–12)
Neutro Abs: 4.8 10*3/uL (ref 1.7–7.7)
Neutrophils Relative %: 67 % (ref 43–77)
RBC: 5.16 MIL/uL — ABNORMAL HIGH (ref 3.87–5.11)

## 2012-05-10 LAB — COMPREHENSIVE METABOLIC PANEL
ALT: 9 U/L (ref 0–35)
AST: 16 U/L (ref 0–37)
BUN: 7 mg/dL (ref 6–23)
Calcium: 9.6 mg/dL (ref 8.4–10.5)
Chloride: 103 mEq/L (ref 96–112)
GFR calc non Af Amer: >90 mL/min (ref >90)
Glucose, Bld: 85 mg/dL (ref 70–99)
Potassium: 3.5 mEq/L (ref 3.5–5.1)
Total Protein: 7.9 g/dL (ref 6.0–8.3)

## 2012-05-10 LAB — URINALYSIS, ROUTINE W REFLEX MICROSCOPIC
Glucose, UA: NEGATIVE mg/dL
Protein, ur: NEGATIVE mg/dL
Specific Gravity, Urine: 1.017 (ref 1.005–1.030)
Urobilinogen, UA: 0.2 mg/dL (ref 0.0–1.0)

## 2012-05-10 LAB — URINE MICROSCOPIC-ADD ON

## 2012-05-10 LAB — D-DIMER, QUANTITATIVE: D-Dimer, Quant: <0.27 ug/mL-FEU (ref 0.00–0.48)

## 2012-05-10 LAB — POCT PREGNANCY, URINE: Preg Test, Ur: NEGATIVE

## 2012-05-10 MED ORDER — RANITIDINE HCL 150 MG PO TABS: 150.0000 mg | ORAL_TABLET | Freq: Two times a day (BID) | ORAL | Status: DC

## 2012-05-10 MED ORDER — PROCHLORPERAZINE EDISYLATE 5 MG/ML IJ SOLN: 10.0000 mg | Freq: Once | INTRAMUSCULAR | Status: DC

## 2012-05-10 MED ORDER — DIPHENHYDRAMINE HCL 50 MG/ML IJ SOLN: 25.0000 mg | Freq: Once | INTRAMUSCULAR | Status: DC

## 2012-05-10 MED ORDER — DEXAMETHASONE SODIUM PHOSPHATE 10 MG/ML IJ SOLN: 10.0000 mg | Freq: Once | INTRAMUSCULAR | Status: DC

## 2012-05-10 MED ORDER — KETOROLAC TROMETHAMINE 30 MG/ML IJ SOLN: 30.0000 mg | Freq: Once | INTRAMUSCULAR | Status: DC

## 2012-05-10 MED ORDER — SUCRALFATE 1 GM/10ML PO SUSP: 1.0000 g | Freq: Four times a day (QID) | ORAL | Status: DC

## 2012-05-10 MED ORDER — SODIUM CHLORIDE 0.9 % IV BOLUS (SEPSIS): 500.0000 mL | Freq: Once | INTRAVENOUS | Status: DC

## 2012-05-10 NOTE — ED Notes (Signed)
Pt c/o abd pain, lower back pain, and SOB x's 3-4 days. Pt reports nausea and fever.

## 2012-05-10 NOTE — ED Provider Notes (Signed)
History     CSN: [id number]  Arrival date & time 05/10/12  1750   First MD Initiated Contact with Patient 05/10/12 1932      Chief Complaint  Patient presents with  . Abdominal Pain  . Back Pain    (Consider location/radiation/quality/duration/timing/severity/associated sxs/prior treatment) HPI Comments: Patient comes to the emergency department for evaluation of abdominal pain. Patient was seen 4 days ago at [facility] for similar complaints. Patient has had pain in the upper abdomen that goes into her lower back area. Patient reports recent episode of pancreatitis and symptoms are somewhat similar, but not as severe. Patient thinks he had a fever last AP she was hot and sweaty, could not take her temperature. She has some pain along the left rib anteriorly as well. Intermittently she feels slightly short of breath. She reports a previous history of bronchitis and asthma as a child.  Patient is a 22 y.o. female presenting with abdominal pain and back pain.  Abdominal Pain Associated symptoms: shortness of breath   Back Pain Associated symptoms: abdominal pain     Past Medical History  Diagnosis Date  . Bronchitis   . Pancreatitis     Past Surgical History  Procedure Laterality Date  . Ovarian cyst surgery      History reviewed. No pertinent family history.  History  Substance Use Topics  . Smoking status: Never Smoker   . Smokeless tobacco: Not on file  . Alcohol Use: No    OB History   Grav Para Term Preterm Abortions TAB SAB Ect Mult Living                  Review of Systems  Respiratory: Positive for shortness of breath.   Gastrointestinal: Positive for abdominal pain.  Musculoskeletal: Positive for back pain.  All other systems reviewed and are negative.    Allergies  Penicillins  Home Medications   Current Outpatient Rx  Name  Route  Sig  Dispense  Refill  . HYDROcodone-acetaminophen (NORCO/VICODIN) 5-325 MG per tablet   Oral   Take 2 tablets  by mouth every 4 (four) hours as needed for pain.   6 tablet   0   . loratadine (CLARITIN) 10 MG tablet   Oral   Take 10 mg by mouth daily.         . ondansetron (ZOFRAN ODT) 4 MG disintegrating tablet   Oral   Take 1 tablet (4 mg total) by mouth every 8 (eight) hours as needed for nausea.   10 tablet   0     BP 129/84  Pulse 81  Temp(Src) 98.4 F (36.9 C) (Oral)  Resp 20  SpO2 98%  LMP 04/26/2012  Physical Exam  Constitutional: She is oriented to person, place, and time. She appears well-developed and well-nourished. No distress.  HENT:  Head: Normocephalic and atraumatic.  Right Ear: Hearing normal.  Nose: Nose normal.  Mouth/Throat: Oropharynx is clear and moist and mucous membranes are normal.  Eyes: Conjunctivae and EOM are normal. Pupils are equal, round, and reactive to light.  Neck: Normal range of motion. Neck supple.  Cardiovascular: Normal rate, regular rhythm, S1 normal and S2 normal.  Exam reveals no gallop and no friction rub.   No murmur heard. Pulmonary/Chest: Effort normal and breath sounds normal. No respiratory distress. She exhibits no tenderness.    Abdominal: Soft. Normal appearance and bowel sounds are normal. There is no hepatosplenomegaly. There is tenderness in the epigastric area and left upper  quadrant. There is no rebound, no guarding, no tenderness at McBurney's point and negative Murphy's sign. No hernia.  Musculoskeletal: Normal range of motion.  Neurological: She is alert and oriented to person, place, and time. She has normal strength. No cranial nerve deficit or sensory deficit. Coordination normal. GCS eye subscore is 4. GCS verbal subscore is 5. GCS motor subscore is 6.  Skin: Skin is warm, dry and intact. No rash noted. No cyanosis.  Psychiatric: She has a normal mood and affect. Her speech is normal and behavior is normal. Thought content normal.    ED Course  Procedures (including critical care time)  Labs Reviewed  CBC WITH  DIFFERENTIAL - Abnormal; Notable for the following:    RBC 5.16 (*)    Platelets 431 (*)    All other components within normal limits  URINALYSIS, ROUTINE W REFLEX MICROSCOPIC - Abnormal; Notable for the following:    Leukocytes, UA TRACE (*)    All other components within normal limits  URINE MICROSCOPIC-ADD ON - Abnormal; Notable for the following:    Squamous Epithelial / LPF MANY (*)    All other components within normal limits  COMPREHENSIVE METABOLIC PANEL  LIPASE, BLOOD  D-DIMER, QUANTITATIVE  POCT PREGNANCY, URINE   No results found.   Diagnosis: Abdominal pain    MDM  Patient presents to the ER for evaluation of abdominal pain. Patient was seen at [facility] several days ago for similar symptoms. Workup at that time was unremarkable. Patient does have a history of pancreatitis. Repeat workup today, however, was entirely unremarkable. She has a normal white count and is afebrile. Abdominal exam reveals tenderness in the epigastric region without rectal quadrant tenderness. No lower abdominal tenderness. No concern for acute cholecystitis or appendicitis. Comprehensive metabolic panel was normal. Patient reports that she has had some intermittent shortness of breath. Lungs are clear currently, no bronchospasm or clinical concern for pneumonia. D-dimer was negative. Patient reassured. She was treated for gastritis earlier and will require continued treatment.        Gilda Crease, MD 05/10/12 [phone]

## 2012-12-14 ENCOUNTER — Emergency Department (HOSPITAL_COMMUNITY)
Admission: EM | Admit: 2012-12-14 | Discharge: 2012-12-14 | Disposition: A | Discharge status: Home / Self Care | Payer: Medicaid Other | Attending: Emergency Medicine | Admitting: Emergency Medicine

## 2012-12-14 ENCOUNTER — Encounter (HOSPITAL_COMMUNITY): Payer: Self-pay

## 2012-12-14 DIAGNOSIS — R42 Dizziness and giddiness: Secondary | ICD-10-CM | POA: Insufficient documentation

## 2012-12-14 DIAGNOSIS — J069 Acute upper respiratory infection, unspecified: Secondary | ICD-10-CM

## 2012-12-14 DIAGNOSIS — Z3202 Encounter for pregnancy test, result negative: Secondary | ICD-10-CM | POA: Insufficient documentation

## 2012-12-14 DIAGNOSIS — Z113 Encounter for screening for infections with a predominantly sexual mode of transmission: Secondary | ICD-10-CM | POA: Insufficient documentation

## 2012-12-14 DIAGNOSIS — R509 Fever, unspecified: Secondary | ICD-10-CM | POA: Insufficient documentation

## 2012-12-14 DIAGNOSIS — Z202 Contact with and (suspected) exposure to infections with a predominantly sexual mode of transmission: Secondary | ICD-10-CM

## 2012-12-14 DIAGNOSIS — R51 Headache: Secondary | ICD-10-CM | POA: Insufficient documentation

## 2012-12-14 DIAGNOSIS — K2970 Gastritis, unspecified, without bleeding: Secondary | ICD-10-CM

## 2012-12-14 DIAGNOSIS — J029 Acute pharyngitis, unspecified: Secondary | ICD-10-CM | POA: Insufficient documentation

## 2012-12-14 DIAGNOSIS — Z880 Allergy status to penicillin: Secondary | ICD-10-CM | POA: Insufficient documentation

## 2012-12-14 DIAGNOSIS — H9209 Otalgia, unspecified ear: Secondary | ICD-10-CM | POA: Insufficient documentation

## 2012-12-14 LAB — CBC WITH DIFFERENTIAL/PLATELET
Basophils Absolute: 0 10*3/uL (ref 0.0–0.1)
Basophils Relative: 0 % (ref 0–1)
Eosinophils Absolute: 0.1 10*3/uL (ref 0.0–0.7)
Eosinophils Relative: 1 % (ref 0–5)
HCT: 39.4 % (ref 36.0–46.0)
MCHC: 32.7 g/dL (ref 30.0–36.0)
MCV: 80.2 fL (ref 78.0–100.0)
Monocytes Absolute: 1 10*3/uL (ref 0.1–1.0)
Platelets: 351 10*3/uL (ref 150–400)
RDW: 13.3 % (ref 11.5–15.5)

## 2012-12-14 LAB — COMPREHENSIVE METABOLIC PANEL
AST: 14 U/L (ref 0–37)
Albumin: 4.1 g/dL (ref 3.5–5.2)
Calcium: 9.5 mg/dL (ref 8.4–10.5)
Creatinine, Ser: 0.73 mg/dL (ref 0.50–1.10)
GFR calc non Af Amer: >90 mL/min (ref >90)
Total Protein: 8 g/dL (ref 6.0–8.3)

## 2012-12-14 LAB — URINALYSIS, ROUTINE W REFLEX MICROSCOPIC
Glucose, UA: NEGATIVE mg/dL
Protein, ur: NEGATIVE mg/dL
Specific Gravity, Urine: 1.019 (ref 1.005–1.030)
pH: 6 (ref 5.0–8.0)

## 2012-12-14 LAB — URINE MICROSCOPIC-ADD ON

## 2012-12-14 LAB — RAPID STREP SCREEN (MED CTR MEBANE ONLY): Streptococcus, Group A Screen (Direct): NEGATIVE

## 2012-12-14 MED ORDER — AZITHROMYCIN 250 MG PO TABS
1000.0000 mg | ORAL_TABLET | Freq: Once | ORAL | Status: AC
  Administered 2012-12-14: 1000 mg via ORAL
  Filled 2012-12-14: qty 4

## 2012-12-14 MED ORDER — ONDANSETRON HCL 4 MG/2ML IJ SOLN
4.0000 mg | Freq: Once | INTRAMUSCULAR | Status: AC
  Administered 2012-12-14: 4 mg via INTRAVENOUS
  Filled 2012-12-14: qty 2

## 2012-12-14 MED ORDER — DEXTROSE 5 % IV SOLN
250.0000 mg | Freq: Once | INTRAVENOUS | Status: AC
  Administered 2012-12-14: 250 mg via INTRAVENOUS
  Filled 2012-12-14: qty 250

## 2012-12-14 MED ORDER — METRONIDAZOLE 500 MG PO TABS: 500.0000 mg | ORAL_TABLET | Freq: Two times a day (BID) | ORAL | Status: DC

## 2012-12-14 MED ORDER — SODIUM CHLORIDE 0.9 % IV BOLUS (SEPSIS)
1000.0000 mL | Freq: Once | INTRAVENOUS | Status: AC
  Administered 2012-12-14: 1000 mL via INTRAVENOUS

## 2012-12-14 MED ORDER — SODIUM CHLORIDE 0.9 % IV SOLN
INTRAVENOUS | Status: DC
  Administered 2012-12-14: 15:00:00 via INTRAVENOUS

## 2012-12-14 MED ORDER — RANITIDINE HCL 150 MG PO TABS: 150.0000 mg | ORAL_TABLET | Freq: Two times a day (BID) | ORAL | Status: DC

## 2012-12-14 NOTE — ED Provider Notes (Addendum)
CSN: [id number]     Arrival date & time 12/14/12  1343 History   First MD Initiated Contact with Patient 12/14/12 1459     Chief Complaint  Patient presents with  . Abdominal Pain   (Consider location/radiation/quality/duration/timing/severity/associated sxs/prior Treatment) Patient is a 22 y.o. female presenting with abdominal pain. The history is provided by the patient.  Abdominal Pain Pain location:  Epigastric Pain quality: aching and gnawing   Pain radiates to:  Does not radiate Pain severity:  Moderate Onset quality:  Gradual Duration:  3 days Timing:  Constant Progression:  Worsening Chronicity:  Recurrent Context comment:  States in the past told she had pancreatitis due to her OCP's but denies etoh, drugs or gallbladder disease Relieved by:  Nothing Exacerbated by: greasy foods. Ineffective treatments:  None tried Associated symptoms: chills, fever and sore throat   Associated symptoms: no chest pain, no cough, no diarrhea, no dysuria, no nausea, no shortness of breath, no vaginal bleeding, no vaginal discharge and no vomiting   Associated symptoms comment:  Right ear pain and headache.  URI sx started appx 2 days ago Risk factors: no alcohol abuse, no aspirin use, has not had multiple surgeries, no NSAID use, not obese, not pregnant and no recent hospitalization     Past Medical History  Diagnosis Date  . Bronchitis   . Pancreatitis    Past Surgical History  Procedure Laterality Date  . Ovarian cyst surgery     History reviewed. No pertinent family history. History  Substance Use Topics  . Smoking status: Never Smoker   . Smokeless tobacco: Not on file  . Alcohol Use: No   OB History   Grav Para Term Preterm Abortions TAB SAB Ect Mult Living                 Review of Systems  Constitutional: Positive for fever and chills.  HENT: Positive for ear pain, sinus pressure and sore throat.   Respiratory: Negative for cough and shortness of breath.     Cardiovascular: Negative for chest pain.  Gastrointestinal: Positive for abdominal pain. Negative for nausea, vomiting and diarrhea.  Genitourinary: Negative for dysuria, flank pain, vaginal bleeding, vaginal discharge and vaginal pain.  Neurological: Positive for light-headedness and headaches.       Lightheaded with standing  All other systems reviewed and are negative.    Allergies  Penicillins  Home Medications   Current Outpatient Rx  Name  Route  Sig  Dispense  Refill  . acetaminophen (TYLENOL) 500 MG tablet   Oral   Take 1,000 mg by mouth every 6 (six) hours as needed for headache.          BP 118/81  Pulse 97  Temp(Src) 98.4 F (36.9 C) (Oral)  Resp 16  SpO2 100%  LMP 11/20/2012 Physical Exam  Nursing note and vitals reviewed. Constitutional: She is oriented to person, place, and time. She appears well-developed and well-nourished. No distress.  HENT:  Head: Normocephalic and atraumatic.  Right Ear: Tympanic membrane and ear canal normal.  Left Ear: Tympanic membrane and ear canal normal.  Nose: Mucosal edema present.  Mouth/Throat: Mucous membranes are normal. No trismus in the jaw. Posterior oropharyngeal erythema present. No oropharyngeal exudate or tonsillar abscesses.  Eyes: Conjunctivae and EOM are normal. Pupils are equal, round, and reactive to light.  Neck: Normal range of motion. Neck supple. No spinous process tenderness and no muscular tenderness present. No Brudzinski's sign and no Kernig's sign noted.  Cardiovascular:  Normal rate, regular rhythm and intact distal pulses.   No murmur heard. Pulmonary/Chest: Effort normal and breath sounds normal. No respiratory distress. She has no wheezes. She has no rales.  Abdominal: Soft. Normal appearance. She exhibits no distension. There is no hepatomegaly. There is tenderness in the epigastric area. There is no rebound, no guarding, no CVA tenderness, no tenderness at McBurney's point and negative Murphy's  sign.    Mild pain when palpate in the epigastric area.  No significant LUQ or RUQ pain.  Musculoskeletal: Normal range of motion. She exhibits no edema and no tenderness.  Lymphadenopathy:    She has no cervical adenopathy.  Neurological: She is alert and oriented to person, place, and time.  Skin: Skin is warm and dry. No rash noted. No erythema.  Psychiatric: She has a normal mood and affect. Her behavior is normal.    ED Course  Procedures (including critical care time) Labs Review Labs Reviewed  CBC WITH DIFFERENTIAL - Abnormal; Notable for the following:    WBC 10.9 (*)    Neutro Abs 8.2 (*)    All other components within normal limits  COMPREHENSIVE METABOLIC PANEL - Abnormal; Notable for the following:    Total Bilirubin 0.2 (*)    All other components within normal limits  URINALYSIS, ROUTINE W REFLEX MICROSCOPIC - Abnormal; Notable for the following:    APPearance CLOUDY (*)    Leukocytes, UA MODERATE (*)    All other components within normal limits  URINE MICROSCOPIC-ADD ON - Abnormal; Notable for the following:    Squamous Epithelial / LPF MANY (*)    Bacteria, UA MANY (*)    All other components within normal limits  RAPID STREP SCREEN  URINE CULTURE  CULTURE, GROUP A STREP  LIPASE, BLOOD  PREGNANCY, URINE   Imaging Review No results found.  EKG Interpretation   None       MDM   1. URI (upper respiratory infection)   2. Gastritis   3. Exposure to STD     Patient with a prior history of pancreatitis 4-6 months ago who presents today with 3 days of epigastric tenderness without associated symptoms. Patient denies alcohol use, NSAID or aspirin use and states in the past for pancreatitis was do to her birth control pill which is she is no longer taking. She has mild epigastric tenderness but no other significant pain on exam.  Feel most likely that this is gastritis however her because of her past history we'll check labs to insure normal LFTs and lipase.  Patient also given a fluid bolus because she describes feeling lightheaded with standing and she is not eating or drinking much home. Secondly for the last 2 days she's been complaining of URI symptoms with subjective fever and chills, congestion, headache, sore throat and ear pain. She denies cough and does have a history of strep throat. On exam she has pharyngeal erythema without exudate or signs of PTA, RPA or epiglottitis.  Rapid strep pending  4:52 PM Labs all wnl.  UA is contaminated and culture sent.  Pt feeling better after fluids.  DDX with URI and gastritis will place on H2 blocker.  Prior to d/c pt's significant other seen here earlier and dx with trichomonas and urethritis.  Pt is sexually active with this man and does not use protection.  Despite being assymptomatic will treat with rocephin/azithro and flagyl.  Gwyneth Sprout, MD 12/14/12 1653  Gwyneth Sprout, MD 12/14/12 [phone]

## 2012-12-14 NOTE — ED Notes (Signed)
Pt actively vomiting.

## 2012-12-14 NOTE — ED Notes (Signed)
Pt  Nauseated.

## 2012-12-14 NOTE — ED Notes (Signed)
She c/o upper abd. Pain x 3 days.  She states she recently was seen here for similar pain, and was dx with pancreatitis.  She denies fever; and is in no distress.

## 2012-12-15 LAB — URINE CULTURE

## 2012-12-16 LAB — CULTURE, GROUP A STREP

## 2012-12-22 ENCOUNTER — Telehealth (HOSPITAL_COMMUNITY): Payer: Self-pay

## 2013-01-19 ENCOUNTER — Emergency Department (HOSPITAL_COMMUNITY)
Admission: EM | Admit: 2013-01-19 | Discharge: 2013-01-20 | Disposition: A | Discharge status: Home / Self Care | Payer: Medicaid Other | Attending: Emergency Medicine | Admitting: Emergency Medicine

## 2013-01-19 ENCOUNTER — Encounter (HOSPITAL_COMMUNITY): Payer: Self-pay | Admitting: Emergency Medicine

## 2013-01-19 DIAGNOSIS — R5381 Other malaise: Secondary | ICD-10-CM | POA: Insufficient documentation

## 2013-01-19 DIAGNOSIS — R509 Fever, unspecified: Secondary | ICD-10-CM | POA: Insufficient documentation

## 2013-01-19 DIAGNOSIS — Z8709 Personal history of other diseases of the respiratory system: Secondary | ICD-10-CM | POA: Insufficient documentation

## 2013-01-19 DIAGNOSIS — M791 Myalgia, unspecified site: Secondary | ICD-10-CM

## 2013-01-19 DIAGNOSIS — M79609 Pain in unspecified limb: Secondary | ICD-10-CM | POA: Insufficient documentation

## 2013-01-19 DIAGNOSIS — Z8719 Personal history of other diseases of the digestive system: Secondary | ICD-10-CM | POA: Insufficient documentation

## 2013-01-19 DIAGNOSIS — R5383 Other fatigue: Secondary | ICD-10-CM

## 2013-01-19 DIAGNOSIS — R110 Nausea: Secondary | ICD-10-CM | POA: Insufficient documentation

## 2013-01-19 DIAGNOSIS — Z9889 Other specified postprocedural states: Secondary | ICD-10-CM | POA: Insufficient documentation

## 2013-01-19 DIAGNOSIS — Z880 Allergy status to penicillin: Secondary | ICD-10-CM | POA: Insufficient documentation

## 2013-01-19 DIAGNOSIS — M549 Dorsalgia, unspecified: Secondary | ICD-10-CM | POA: Insufficient documentation

## 2013-01-19 DIAGNOSIS — N946 Dysmenorrhea, unspecified: Secondary | ICD-10-CM | POA: Insufficient documentation

## 2013-01-19 DIAGNOSIS — M353 Polymyalgia rheumatica: Secondary | ICD-10-CM | POA: Insufficient documentation

## 2013-01-19 NOTE — ED Notes (Signed)
Pt presents with c/o abdominal pain and generalized body aches. Pt says that she is currently on her cycle and was passing clots earlier today, more than usual. Pt says she has had a cough sometimes during the night.

## 2013-01-20 LAB — URINALYSIS, ROUTINE W REFLEX MICROSCOPIC
Bilirubin Urine: NEGATIVE
GLUCOSE, UA: NEGATIVE mg/dL
Hgb urine dipstick: NEGATIVE
KETONES UR: NEGATIVE mg/dL
LEUKOCYTES UA: NEGATIVE
NITRITE: NEGATIVE
PROTEIN: NEGATIVE mg/dL
Specific Gravity, Urine: 1.043 — ABNORMAL HIGH (ref 1.005–1.030)
Urobilinogen, UA: 0.2 mg/dL (ref 0.0–1.0)
pH: 6 (ref 5.0–8.0)

## 2013-01-20 LAB — PREGNANCY, URINE: PREG TEST UR: NEGATIVE

## 2013-01-20 MED ORDER — NAPROXEN 500 MG PO TABS: 500.0000 mg | ORAL_TABLET | Freq: Two times a day (BID) | ORAL | Status: DC

## 2013-01-20 MED ORDER — ONDANSETRON 8 MG PO TBDP
8.0000 mg | ORAL_TABLET | Freq: Once | ORAL | Status: AC
  Administered 2013-01-20: 8 mg via ORAL
  Filled 2013-01-20: qty 1

## 2013-01-20 MED ORDER — TRAMADOL HCL 50 MG PO TABS
50.0000 mg | ORAL_TABLET | Freq: Once | ORAL | Status: AC
  Administered 2013-01-20: 50 mg via ORAL
  Filled 2013-01-20: qty 1

## 2013-01-20 MED ORDER — TRAMADOL HCL 50 MG PO TABS: 50.0000 mg | ORAL_TABLET | Freq: Four times a day (QID) | ORAL | Status: DC | PRN

## 2013-01-20 MED ORDER — ONDANSETRON 8 MG PO TBDP
8.0000 mg | ORAL_TABLET | Freq: Three times a day (TID) | ORAL | Status: DC | PRN
Start: 2013-01-20 — End: 2013-08-04

## 2013-01-20 NOTE — Discharge Instructions (Signed)
Dysmenorrhea °Menstrual cramps (dysmenorrhea) are caused by the muscles of the uterus tightening (contracting) during a menstrual period. For some women, this discomfort is merely bothersome. For others, dysmenorrhea can be severe enough to interfere with everyday activities for a few days each month. °Primary dysmenorrhea is menstrual cramps that last a couple of days when you start having menstrual periods or soon after. This often begins after a teenager starts having her period. As a woman gets older or has a baby, the cramps will usually lessen or disappear. Secondary dysmenorrhea begins later in life, lasts longer, and the pain may be stronger than primary dysmenorrhea. The pain may start before the period and last a few days after the period.  °CAUSES  °Dysmenorrhea is usually caused by an underlying problem, such as: °· The tissue lining the uterus grows outside of the uterus in other areas of the body (endometriosis). °· The endometrial tissue, which normally lines the uterus, is found in or grows into the muscular walls of the uterus (adenomyosis). °· The pelvic blood vessels are engorged with blood just before the menstrual period (pelvic congestive syndrome). °· Overgrowth of cells (polyps) in the lining of the uterus or cervix. °· Falling down of the uterus (prolapse) because of loose or stretched ligaments. °· Depression. °· Bladder problems, infection, or inflammation. °· Problems with the intestine, a tumor, or irritable bowel syndrome. °· Cancer of the female organs or bladder. °· A severely tipped uterus. °· A very tight opening or closed cervix. °· Noncancerous tumors of the uterus (fibroids). °· Pelvic inflammatory disease (PID). °· Pelvic scarring (adhesions) from a previous surgery. °· Ovarian cyst. °· An intrauterine device (IUD) used for birth control. °RISK FACTORS °You may be at greater risk of dysmenorrhea if: °· You are younger than age 30. °· You started puberty early. °· You have  irregular or heavy bleeding. °· You have never given birth. °· You have a family history of this problem. °· You are a smoker. °SIGNS AND SYMPTOMS  °· Cramping or throbbing pain in your lower abdomen. °· Headaches. °· Lower back pain. °· Nausea or vomiting. °· Diarrhea. °· Sweating or dizziness. °· Loose stools. °DIAGNOSIS  °A diagnosis is based on your history, symptoms, physical exam, diagnostic tests, or procedures. Diagnostic tests or procedures may include: °· Blood tests. °· Ultrasonography. °· An examination of the lining of the uterus (dilation and curettage, D&C). °· An examination inside your abdomen or pelvis with a scope (laparoscopy). °· X-rays. °· CT scan. °· MRI. °· An examination inside the bladder with a scope (cystoscopy). °· An examination inside the intestine or stomach with a scope (colonoscopy, gastroscopy). °TREATMENT  °Treatment depends on the cause of the dysmenorrhea. Treatment may include: °· Pain medicine prescribed by your health care provider. °· Birth control pills or an IUD with progesterone hormone in it. °· Hormone replacement therapy. °· Nonsteroidal anti-inflammatory drugs (NSAIDs). These may help stop the production of prostaglandins. °· Surgery to remove adhesions, endometriosis, ovarian cyst, or fibroids. °· Removal of the uterus (hysterectomy). °· Progesterone shots to stop the menstrual period. °· Cutting the nerves on the sacrum that go to the female organs (presacral neurectomy). °· Electric current to the sacral nerves (sacral nerve stimulation). °· Antidepressant medicine. °· Psychiatric therapy, counseling, or group therapy. °· Exercise and physical therapy. °· Meditation and yoga therapy. °· Acupuncture. °HOME CARE INSTRUCTIONS  °· Only take over-the-counter or prescription medicines as directed by your health care provider. °· Place a heating pad   or hot water bottle on your lower back or abdomen. Do not sleep with the heating pad.  Use aerobic exercises, walking,  swimming, biking, and other exercises to help lessen the cramping.  Massage to the lower back or abdomen may help.  Stop smoking.  Avoid alcohol and caffeine. SEEK MEDICAL CARE IF:   Your pain does not get better with medicine.  You have pain with sexual intercourse.  Your pain increases and is not controlled with medicines.  You have abnormal vaginal bleeding with your period.  You develop nausea or vomiting with your period that is not controlled with medicine. SEEK IMMEDIATE MEDICAL CARE IF:  You pass out.  Document Released: 01/05/2005 Document Revised: 09/07/2012 Document Reviewed: 06/23/2012 [facility] Patient Information 2014 [place], [place].  Myalgia, Adult Myalgia is the medical term for muscle pain. It is a symptom of many things. Nearly everyone at some time in their life has this. The most common cause for muscle pain is overuse or straining and more so when you are not in shape. Injuries and muscle bruises cause myalgias. Muscle pain without a history of injury can also be caused by a virus. It frequently comes along with the flu. Myalgia not caused by muscle strain can be present in a large number of infectious diseases. Some autoimmune diseases like lupus and fibromyalgia can cause muscle pain. Myalgia may be mild, or severe. SYMPTOMS  The symptoms of myalgia are simply muscle pain. Most of the time this is short lived and the pain goes away without treatment. DIAGNOSIS  Myalgia is diagnosed by your caregiver by taking your history. This means you tell him when the problems began, what they are, and what has been happening. If this has not been a long term problem, your caregiver may want to watch for a while to see what will happen. If it has been long term, they may want to do additional testing. TREATMENT  The treatment depends on what the underlying cause of the muscle pain is. Often anti-inflammatory medications will help. HOME CARE INSTRUCTIONS  If the pain in your  muscles came from overuse, slow down your activities until the problems go away.  Myalgia from overuse of a muscle can be treated with alternating hot and cold packs on the muscle affected or with cold for the first couple days. If either heat or cold seems to make things worse, stop their use.  Apply ice to the sore area for 15-20 minutes, 03-04 times per day, while awake for the first 2 days of muscle soreness, or as directed. Put the ice in a plastic bag and place a towel between the bag of ice and your skin.  Only take over-the-counter or prescription medicines for pain, discomfort, or fever as directed by your caregiver.  Regular gentle exercise may help if you are not active.  Stretching before strenuous exercise can help lower the risk of myalgia. It is normal when beginning an exercise regimen to feel some muscle pain after exercising. Muscles that have not been used frequently will be sore at first. If the pain is extreme, this may mean injury to a muscle. SEEK MEDICAL CARE IF:  You have an increase in muscle pain that is not relieved with medication.  You begin to run a temperature.  You develop nausea and vomiting.  You develop a stiff and painful neck.  You develop a rash.  You develop muscle pain after a tick bite.  You have continued muscle pain while working out  even after you are in good condition. SEEK IMMEDIATE MEDICAL CARE IF: Any of your problems are getting worse and medications are not helping. MAKE SURE YOU:   Understand these instructions.  Will watch your condition.  Will get help right away if you are not doing well or get worse. Document Released: 11/27/2005 Document Revised: 03/30/2011 Document Reviewed: 02/16/2006 [facility] Patient Information 2014 [place], [place].

## 2013-01-20 NOTE — ED Provider Notes (Signed)
CSN: [id number]     Arrival date & time 01/19/13  2117 History   First MD Initiated Contact with Patient 01/19/13 2338     Chief Complaint  Patient presents with  . Abdominal Pain  . Generalized Body Aches   (Consider location/radiation/quality/duration/timing/severity/associated sxs/prior Treatment) HPI 23 year old female presents to emergency department from home with complaint of 2-3 days of myalgias, mainly around the back of the upper arms, subjective fevers at night, much of his breath at night.  Patient, reports she's been on her menstrual cycle for the last 2 days.  Today she has been passing large clots.  Patient reports generalized fatigue.  She reports diffuse abdominal pain, worse suprapubic and bilateral upper quadrants.  She has had nausea no vomiting.  She reports taking ibuprofen prior to arrival without improvement in symptoms. Past Medical History  Diagnosis Date  . Bronchitis   . Pancreatitis    Past Surgical History  Procedure Laterality Date  . Ovarian cyst surgery     No family history on file. History  Substance Use Topics  . Smoking status: Never Smoker   . Smokeless tobacco: Not on file  . Alcohol Use: No   OB History   Grav Para Term Preterm Abortions TAB SAB Ect Mult Living                 Review of Systems  See History of Present Illness; otherwise all other systems are reviewed and negative Allergies  Penicillins  Home Medications   Current Outpatient Rx  Name  Route  Sig  Dispense  Refill  . ibuprofen (ADVIL,MOTRIN) 200 MG tablet   Oral   Take 200 mg by mouth every 6 (six) hours as needed.          BP 111/68  Pulse 71  Temp(Src) 98.7 F (37.1 C) (Oral)  Resp 14  SpO2 99%  LMP 01/19/2013 Physical Exam  Nursing note and vitals reviewed. Constitutional: She is oriented to person, place, and time. She appears well-developed and well-nourished. No distress.  Patient asleep in the bed with female companion when I enter the room   HENT:   Head: Normocephalic and atraumatic.  Nose: Nose normal.  Mouth/Throat: Oropharynx is clear and moist.  Eyes: Conjunctivae and EOM are normal. Pupils are equal, round, and reactive to light.  Neck: Normal range of motion. Neck supple. No JVD present. No tracheal deviation present. No thyromegaly present.  Cardiovascular: Normal rate, regular rhythm, normal heart sounds and intact distal pulses.  Exam reveals no gallop and no friction rub.   No murmur heard. Pulmonary/Chest: Effort normal and breath sounds normal. No stridor. No respiratory distress. She has no wheezes. She has no rales. She exhibits no tenderness.  Abdominal: Soft. Bowel sounds are normal. She exhibits no distension and no mass. There is tenderness (patient has diffuse tenderness throughout the abdomen, no rebound, no guarding). There is no rebound and no guarding.  Musculoskeletal: Normal range of motion. She exhibits no edema and no tenderness.  Lymphadenopathy:    She has no cervical adenopathy.  Neurological: She is alert and oriented to person, place, and time. She exhibits normal muscle tone. Coordination normal.  Skin: Skin is warm and dry. No rash noted. No erythema. No pallor.  Psychiatric: She has a normal mood and affect. Her behavior is normal. Judgment and thought content normal.    ED Course  Procedures (including critical care time) Labs Review Labs Reviewed  URINALYSIS, ROUTINE W REFLEX MICROSCOPIC - Abnormal; Notable  for the following:    Specific Gravity, Urine 1.043 (*)    All other components within normal limits  PREGNANCY, URINE   Imaging Review No results found.  EKG Interpretation   None       MDM   1. Menstrual cramps   2. Myalgia    23 year old female with myalgias, abdominal pain, currently on menstrual cycle.  We'll check pelvic exam, treat pain and nausea.   Olivia Mackielga M Jakye Mullens, MD 01/20/13 [phone]

## 2013-06-09 ENCOUNTER — Encounter (HOSPITAL_COMMUNITY): Payer: Self-pay | Admitting: Emergency Medicine

## 2013-06-09 ENCOUNTER — Emergency Department (HOSPITAL_COMMUNITY)
Admission: EM | Admit: 2013-06-09 | Discharge: 2013-06-09 | Disposition: A | Discharge status: Home / Self Care | Payer: Medicaid Other | Attending: Emergency Medicine | Admitting: Emergency Medicine

## 2013-06-09 DIAGNOSIS — J309 Allergic rhinitis, unspecified: Secondary | ICD-10-CM | POA: Insufficient documentation

## 2013-06-09 DIAGNOSIS — Z791 Long term (current) use of non-steroidal anti-inflammatories (NSAID): Secondary | ICD-10-CM | POA: Insufficient documentation

## 2013-06-09 DIAGNOSIS — Z880 Allergy status to penicillin: Secondary | ICD-10-CM | POA: Insufficient documentation

## 2013-06-09 DIAGNOSIS — Z8719 Personal history of other diseases of the digestive system: Secondary | ICD-10-CM | POA: Insufficient documentation

## 2013-06-09 DIAGNOSIS — J069 Acute upper respiratory infection, unspecified: Secondary | ICD-10-CM | POA: Insufficient documentation

## 2013-06-09 MED ORDER — CETIRIZINE-PSEUDOEPHEDRINE ER 5-120 MG PO TB12: 1.0000 | ORAL_TABLET | Freq: Two times a day (BID) | ORAL | Status: DC

## 2013-06-09 NOTE — ED Provider Notes (Signed)
Medical screening examination/treatment/procedure(s) were performed by non-physician practitioner and as supervising physician I was immediately available for consultation/collaboration.  Flint Melter, MD 06/09/13 1440

## 2013-06-09 NOTE — Discharge Instructions (Signed)

## 2013-06-09 NOTE — ED Provider Notes (Signed)
CSN: [id number]     Arrival date & time 06/09/13  1322 History   First MD Initiated Contact with Patient 06/09/13 1322     Chief Complaint  Patient presents with  . Sore Throat  . Otalgia   HPI This chart was scribed for non-physician practitioner, Teressa Lower, NP working with Flint Melter, MD, by Andrew Au, ED Scribe. This patient was seen in room WTR5/WTR5 and the patient's care was started at 1:28 PM.  Rebecca Ortiz is a 23 y.o. female who presents to the Emergency Department complaining of a sore throat and otalgia onset 2 days. Pt reports associated nasal congestion and fever of 101 during the night. Pt has taken benadryl and decongestant without relief to symptoms. Pt denies sick contacts. Pt is a smoker. Pt has h/o seasonal allergies. Pt is allergic to penicillin.  Past Medical History  Diagnosis Date  . Bronchitis   . Pancreatitis    Past Surgical History  Procedure Laterality Date  . Ovarian cyst surgery     No family history on file. History  Substance Use Topics  . Smoking status: Never Smoker   . Smokeless tobacco: Not on file  . Alcohol Use: No   OB History   Grav Para Term Preterm Abortions TAB SAB Ect Mult Living                 Review of Systems  Constitutional: Positive for fever.  HENT: Positive for congestion, ear pain and sore throat.   Allergic/Immunologic: Positive for environmental allergies.   Allergies  Penicillins  Home Medications   Prior to Admission medications   Medication Sig Start Date End Date Taking? Authorizing Provider  naproxen (NAPROSYN) 500 MG tablet Take 1 tablet (500 mg total) by mouth 2 (two) times daily. 01/20/13   Olivia Mackie, MD  ondansetron (ZOFRAN-ODT) 8 MG disintegrating tablet Take 1 tablet (8 mg total) by mouth every 8 (eight) hours as needed for nausea or vomiting. 01/20/13   Olivia Mackie, MD  traMADol (ULTRAM) 50 MG tablet Take 1 tablet (50 mg total) by mouth every 6 (six) hours as needed. 01/20/13   Olivia Mackie, MD   There were no vitals taken for this visit. Physical Exam  Nursing note and vitals reviewed. Constitutional: She is oriented to person, place, and time. She appears well-developed and well-nourished. No distress.  HENT:  Head: Normocephalic and atraumatic.  Mouth/Throat: Posterior oropharyngeal erythema present. No oropharyngeal exudate or posterior oropharyngeal edema.  Eyes: EOM are normal.  Neck: Neck supple.  Cardiovascular: Normal rate.   Pulmonary/Chest: Effort normal.  Musculoskeletal: Normal range of motion.  Neurological: She is alert and oriented to person, place, and time.  Skin: Skin is warm and dry.  Psychiatric: She has a normal mood and affect. Her behavior is normal.   ED Course  Procedures (including critical care time)  Labs Review Labs Reviewed - No data to display  Imaging Review No results found.   EKG Interpretation None      MDM   Final diagnoses:  URI (upper respiratory infection)   Pt can use otc medication.likely allergic in nature  I personally performed the services described in this documentation, which was scribed in my presence. The recorded information has been reviewed and is accurate.      Teressa Lower, NP 06/09/13 1359

## 2013-06-09 NOTE — ED Notes (Signed)
Pt c/o sore throat x 2-3 days, pt states she feels hot at times but is shaking too. Pt also c/o bilateral ear pain x 2-3 days. Pt feels congested.

## 2013-08-04 ENCOUNTER — Emergency Department (HOSPITAL_COMMUNITY)
Admission: EM | Admit: 2013-08-04 | Discharge: 2013-08-05 | Disposition: A | Discharge status: Home / Self Care | Payer: Medicaid Other | Attending: Emergency Medicine | Admitting: Emergency Medicine

## 2013-08-04 ENCOUNTER — Encounter (HOSPITAL_COMMUNITY): Payer: Self-pay | Admitting: Emergency Medicine

## 2013-08-04 DIAGNOSIS — Z8719 Personal history of other diseases of the digestive system: Secondary | ICD-10-CM | POA: Insufficient documentation

## 2013-08-04 DIAGNOSIS — Z79899 Other long term (current) drug therapy: Secondary | ICD-10-CM | POA: Insufficient documentation

## 2013-08-04 DIAGNOSIS — Z791 Long term (current) use of non-steroidal anti-inflammatories (NSAID): Secondary | ICD-10-CM | POA: Insufficient documentation

## 2013-08-04 DIAGNOSIS — M545 Low back pain, unspecified: Secondary | ICD-10-CM | POA: Insufficient documentation

## 2013-08-04 DIAGNOSIS — Z880 Allergy status to penicillin: Secondary | ICD-10-CM | POA: Insufficient documentation

## 2013-08-04 DIAGNOSIS — R000 Tachycardia, unspecified: Secondary | ICD-10-CM | POA: Insufficient documentation

## 2013-08-04 DIAGNOSIS — H9209 Otalgia, unspecified ear: Secondary | ICD-10-CM | POA: Insufficient documentation

## 2013-08-04 DIAGNOSIS — Z3202 Encounter for pregnancy test, result negative: Secondary | ICD-10-CM | POA: Insufficient documentation

## 2013-08-04 DIAGNOSIS — J020 Streptococcal pharyngitis: Secondary | ICD-10-CM | POA: Insufficient documentation

## 2013-08-04 LAB — URINALYSIS, ROUTINE W REFLEX MICROSCOPIC
Bilirubin Urine: NEGATIVE
Glucose, UA: NEGATIVE mg/dL
KETONES UR: 15 mg/dL — AB
Leukocytes, UA: NEGATIVE
NITRITE: NEGATIVE
PH: 7 (ref 5.0–8.0)
Protein, ur: NEGATIVE mg/dL
Specific Gravity, Urine: 1.006 (ref 1.005–1.030)
Urobilinogen, UA: 0.2 mg/dL (ref 0.0–1.0)

## 2013-08-04 LAB — CBC WITH DIFFERENTIAL/PLATELET
BASOS ABS: 0 10*3/uL (ref 0.0–0.1)
BASOS PCT: 0 % (ref 0–1)
EOS ABS: 0.1 10*3/uL (ref 0.0–0.7)
EOS PCT: 0 % (ref 0–5)
HCT: 37.6 % (ref 36.0–46.0)
Hemoglobin: 12.4 g/dL (ref 12.0–15.0)
Lymphocytes Relative: 9 % — ABNORMAL LOW (ref 12–46)
Lymphs Abs: 1.3 10*3/uL (ref 0.7–4.0)
MCH: 26.2 pg (ref 26.0–34.0)
MCHC: 33 g/dL (ref 30.0–36.0)
MCV: 79.3 fL (ref 78.0–100.0)
Monocytes Absolute: 1.3 10*3/uL — ABNORMAL HIGH (ref 0.1–1.0)
Monocytes Relative: 9 % (ref 3–12)
NEUTROS PCT: 82 % — AB (ref 43–77)
Neutro Abs: 11.1 10*3/uL — ABNORMAL HIGH (ref 1.7–7.7)
PLATELETS: 301 10*3/uL (ref 150–400)
RBC: 4.74 MIL/uL (ref 3.87–5.11)
RDW: 13.8 % (ref 11.5–15.5)
WBC: 13.7 10*3/uL — ABNORMAL HIGH (ref 4.0–10.5)

## 2013-08-04 LAB — URINE MICROSCOPIC-ADD ON

## 2013-08-04 LAB — BASIC METABOLIC PANEL
Anion gap: 15 (ref 5–15)
BUN: 5 mg/dL — ABNORMAL LOW (ref 6–23)
CO2: 25 mEq/L (ref 19–32)
Calcium: 9.1 mg/dL (ref 8.4–10.5)
Chloride: 98 mEq/L (ref 96–112)
Creatinine, Ser: 0.8 mg/dL (ref 0.50–1.10)
Glucose, Bld: 91 mg/dL (ref 70–99)
POTASSIUM: 3.3 meq/L — AB (ref 3.7–5.3)
SODIUM: 138 meq/L (ref 137–147)

## 2013-08-04 LAB — PREGNANCY, URINE: PREG TEST UR: NEGATIVE

## 2013-08-04 LAB — RAPID STREP SCREEN (MED CTR MEBANE ONLY): Streptococcus, Group A Screen (Direct): NEGATIVE

## 2013-08-04 MED ORDER — ACETAMINOPHEN 325 MG PO TABS
650.0000 mg | ORAL_TABLET | Freq: Once | ORAL | Status: AC
Start: 2013-08-04 — End: 2013-08-04
  Administered 2013-08-04: 650 mg via ORAL
  Filled 2013-08-04: qty 2

## 2013-08-04 MED ORDER — SODIUM CHLORIDE 0.9 % IV BOLUS (SEPSIS)
1000.0000 mL | Freq: Once | INTRAVENOUS | Status: AC
  Administered 2013-08-04: 1000 mL via INTRAVENOUS

## 2013-08-04 MED ORDER — CLINDAMYCIN HCL 150 MG PO CAPS: 300.0000 mg | ORAL_CAPSULE | Freq: Three times a day (TID) | ORAL | Status: DC

## 2013-08-04 MED ORDER — DEXAMETHASONE SODIUM PHOSPHATE 10 MG/ML IJ SOLN
10.0000 mg | Freq: Once | INTRAMUSCULAR | Status: AC
  Administered 2013-08-05: 10 mg via INTRAVENOUS
  Filled 2013-08-04: qty 1

## 2013-08-04 MED ORDER — CLINDAMYCIN HCL 300 MG PO CAPS
300.0000 mg | ORAL_CAPSULE | Freq: Once | ORAL | Status: AC
  Administered 2013-08-05: 300 mg via ORAL
  Filled 2013-08-04: qty 1

## 2013-08-04 MED ORDER — HYDROCODONE-ACETAMINOPHEN 7.5-325 MG/15ML PO SOLN: 15.0000 mL | ORAL | Status: DC | PRN

## 2013-08-04 NOTE — ED Notes (Signed)
Pt c/o fever, sore throat, ear pain, back pain, tingling to hands and feet x 2 -3 days.

## 2013-08-04 NOTE — ED Provider Notes (Signed)
CSN: [id number]     Arrival date & time 08/04/13  2204 History   First MD Initiated Contact with Patient 08/04/13 2224     Chief Complaint  Patient presents with  . Fever  . Otalgia  . Sore Throat     (Consider location/radiation/quality/duration/timing/severity/associated sxs/prior Treatment) HPI Comments: The patient is a 23 year old female presenting emergency room chief complaint of sore throat and bilateral ear pain and myalgias for several days. She reports bilateral ear pain onset approximately 4 days ago. She reports generalized myalgias and low back pain for 3 days. Reports zytec-D, ibuprofen, Midol without resolution of symptoms.  She reports sore throat for 3 days. She reports last antipyretics earlier this morning. No known sick contacts. Patient's last menstrual period was 08/01/2013.  Patient is a 23 y.o. female presenting with fever, ear pain, and pharyngitis. The history is provided by the patient. No language interpreter was used.  Fever Associated symptoms: ear pain   Associated symptoms: no dysuria, no nausea and no vomiting   Otalgia Associated symptoms: fever   Associated symptoms: no abdominal pain and no vomiting   Sore Throat Associated symptoms include a fever. Pertinent negatives include no abdominal pain, nausea or vomiting.    Past Medical History  Diagnosis Date  . Bronchitis   . Pancreatitis    Past Surgical History  Procedure Laterality Date  . Ovarian cyst surgery     No family history on file. History  Substance Use Topics  . Smoking status: Never Smoker   . Smokeless tobacco: Not on file  . Alcohol Use: No   OB History   Grav Para Term Preterm Abortions TAB SAB Ect Mult Living                 Review of Systems  Constitutional: Positive for fever.  HENT: Positive for ear pain.   Gastrointestinal: Negative for nausea, vomiting and abdominal pain.  Genitourinary: Negative for dysuria and urgency.  Musculoskeletal: Positive for back  pain.      Allergies  Penicillins  Home Medications   Prior to Admission medications   Medication Sig Start Date End Date Taking? Authorizing Provider  cetirizine-pseudoephedrine (ZYRTEC-D) 5-120 MG per tablet Take 1 tablet by mouth 2 (two) times daily. 06/09/13   Teressa Lower, NP  naproxen (NAPROSYN) 500 MG tablet Take 1 tablet (500 mg total) by mouth 2 (two) times daily. 01/20/13   Olivia Mackie, MD  ondansetron (ZOFRAN-ODT) 8 MG disintegrating tablet Take 1 tablet (8 mg total) by mouth every 8 (eight) hours as needed for nausea or vomiting. 01/20/13   Olivia Mackie, MD  traMADol (ULTRAM) 50 MG tablet Take 1 tablet (50 mg total) by mouth every 6 (six) hours as needed. 01/20/13   Olivia Mackie, MD   BP 119/61  Pulse 93  Temp(Src) 100.8 F (38.2 C) (Oral)  Resp 18  Ht 5\' 3"  (1.6 m)  Wt 130 lb (58.968 kg)  BMI 23.03 kg/m2  SpO2 100%  LMP 08/01/2013 Physical Exam  Nursing note and vitals reviewed. Constitutional: She is oriented to person, place, and time. She appears well-developed and well-nourished.  Non-toxic appearance. She does not have a sickly appearance. She does not appear ill. No distress.  HENT:  Head: Normocephalic and atraumatic.  Right Ear: Tympanic membrane normal. Tympanic membrane is not erythematous, not retracted and not bulging. No middle ear effusion.  Left Ear: Tympanic membrane normal. Tympanic membrane is not erythematous, not retracted and not bulging.  No middle  ear effusion.  Nose: No rhinorrhea.  Mouth/Throat: Uvula is midline and mucous membranes are normal. No trismus in the jaw. Oropharyngeal exudate, posterior oropharyngeal edema and posterior oropharyngeal erythema present.  Tonsils R>L on exam. Handling secretions.  Eyes: EOM are normal. Pupils are equal, round, and reactive to light.  Neck: Normal range of motion. Neck supple.  Cardiovascular: Regular rhythm.  Tachycardia present.   Pulmonary/Chest: Not tachypneic. No respiratory distress. She has  no decreased breath sounds. She has no wheezes. She has no rhonchi. She has no rales.  Abdominal: Soft. There is no tenderness. There is no rebound and no guarding.  Musculoskeletal: Normal range of motion.  Lymphadenopathy:       Head (right side): Tonsillar adenopathy present.       Head (left side): Tonsillar adenopathy present.  Neurological: She is alert and oriented to person, place, and time.  Skin: She is not diaphoretic.  Psychiatric: She has a normal mood and affect. Her behavior is normal.    ED Course  Procedures (including critical care time) Labs Review Results for orders placed during the hospital encounter of 08/04/13  RAPID STREP SCREEN      Result Value Ref Range   Streptococcus, Group A Screen (Direct) NEGATIVE  NEGATIVE  CBC WITH DIFFERENTIAL      Result Value Ref Range   WBC 13.7 (*) 4.0 - 10.5 K/uL   RBC 4.74  3.87 - 5.11 MIL/uL   Hemoglobin 12.4  12.0 - 15.0 g/dL   HCT 45.437.6  09.836.0 - 11.946.0 %   MCV 79.3  78.0 - 100.0 fL   MCH 26.2  26.0 - 34.0 pg   MCHC 33.0  30.0 - 36.0 g/dL   RDW 14.713.8  82.911.5 - 56.215.5 %   Platelets 301  150 - 400 K/uL   Neutrophils Relative % 82 (*) 43 - 77 %   Neutro Abs 11.1 (*) 1.7 - 7.7 K/uL   Lymphocytes Relative 9 (*) 12 - 46 %   Lymphs Abs 1.3  0.7 - 4.0 K/uL   Monocytes Relative 9  3 - 12 %   Monocytes Absolute 1.3 (*) 0.1 - 1.0 K/uL   Eosinophils Relative 0  0 - 5 %   Eosinophils Absolute 0.1  0.0 - 0.7 K/uL   Basophils Relative 0  0 - 1 %   Basophils Absolute 0.0  0.0 - 0.1 K/uL  BASIC METABOLIC PANEL      Result Value Ref Range   Sodium 138  137 - 147 mEq/L   Potassium 3.3 (*) 3.7 - 5.3 mEq/L   Chloride 98  96 - 112 mEq/L   CO2 25  19 - 32 mEq/L   Glucose, Bld 91  70 - 99 mg/dL   BUN 5 (*) 6 - 23 mg/dL   Creatinine, Ser 1.300.80  0.50 - 1.10 mg/dL   Calcium 9.1  8.4 - 86.510.5 mg/dL   GFR calc non Af Amer >90  >90 mL/min   GFR calc Af Amer >90  >90 mL/min   Anion gap 15  5 - 15  PREGNANCY, URINE      Result Value Ref Range    Preg Test, Ur NEGATIVE  NEGATIVE  URINALYSIS, ROUTINE W REFLEX MICROSCOPIC      Result Value Ref Range   Color, Urine YELLOW  YELLOW   APPearance CLEAR  CLEAR   Specific Gravity, Urine 1.006  1.005 - 1.030   pH 7.0  5.0 - 8.0   Glucose, UA NEGATIVE  NEGATIVE mg/dL   Hgb urine dipstick MODERATE (*) NEGATIVE   Bilirubin Urine NEGATIVE  NEGATIVE   Ketones, ur 15 (*) NEGATIVE mg/dL   Protein, ur NEGATIVE  NEGATIVE mg/dL   Urobilinogen, UA 0.2  0.0 - 1.0 mg/dL   Nitrite NEGATIVE  NEGATIVE   Leukocytes, UA NEGATIVE  NEGATIVE  URINE MICROSCOPIC-ADD ON      Result Value Ref Range   Squamous Epithelial / LPF FEW (*) RARE   WBC, UA 0-2  <3 WBC/hpf   RBC / HPF 3-6  <3 RBC/hpf   Bacteria, UA RARE  RARE   No results found  MDM   Final diagnoses:  Strep pharyngitis   Patient presents with sore throat, right tonsil slightly larger than the left. Discussed with Dr. Juleen China also evaluated the patient during this exam and does not feel that imaging is warranted, doubt peritonsillar or tonsillar abscess at this time. Patient is febrile, 100.8, in emergency department tyelnol given.  UA shows hemoglobin, likely contaminant her menses. Slight leukocytosis of 13.7. Rapid strip negative, will treat based on fever, exudative pharyngitis. Discussed lab results, imaging results, and treatment plan with the patient. Return precautions given. Reports understanding and no other concerns at this time.  Patient is stable for discharge at this time.  Meds given in ED:  Medications  acetaminophen (TYLENOL) tablet 650 mg (650 mg Oral Given 08/04/13 2252)  sodium chloride 0.9 % bolus 1,000 mL (0 mLs Intravenous Stopped 08/05/13 0034)  clindamycin (CLEOCIN) capsule 300 mg (300 mg Oral Given 08/05/13 0034)  dexamethasone (DECADRON) injection 10 mg (10 mg Intravenous Given 08/05/13 0034)    Discharge Medication List as of 08/04/2013 11:55 PM    START taking these medications   Details  clindamycin (CLEOCIN) 150 MG  capsule Take 2 capsules (300 mg total) by mouth 3 (three) times daily., Starting 08/04/2013, Until Discontinued, Print    HYDROcodone-acetaminophen (HYCET) 7.5-325 mg/15 ml solution Take 15 mLs by mouth every 4 (four) hours as needed for moderate pain or severe pain., Starting 08/04/2013, Until Discontinued, Print            Clabe Seal, PA-C 08/06/13 0104

## 2013-08-04 NOTE — Discharge Instructions (Signed)
Call for a follow up appointment with a Family or Primary Care Provider.  Return to the ED if symptoms worsen.   Take medication as prescribed.  Your liquid medication (hycet) contains tylenol.  Do not take additional tylenol while taking this medication.  Do not drive or operate heavy machinery while taking this medication.   Emergency Department Resource Guide 1) Find a Doctor and Pay Out of Pocket Although you won't have to find out who is covered by your insurance plan, it is a good idea to ask around and get recommendations. You will then need to call the office and see if the doctor you have chosen will accept you as a new patient and what types of options they offer for patients who are self-pay. Some doctors offer discounts or will set up payment plans for their patients who do not have insurance, but you will need to ask so you aren't surprised when you get to your appointment.  2) Contact Your Local Health Department Not all health departments have doctors that can see patients for sick visits, but many do, so it is worth a call to see if yours does. If you don't know where your local health department is, you can check in your phone book. The CDC also has a tool to help you locate your state's health department, and many state websites also have listings of all of their local health departments.  3) Find a Walk-in Clinic If your illness is not likely to be very severe or complicated, you may want to try a walk in clinic. These are popping up all over the country in pharmacies, drugstores, and shopping centers. They're usually staffed by nurse practitioners or physician assistants that have been trained to treat common illnesses and complaints. They're usually fairly quick and inexpensive. However, if you have serious medical issues or chronic medical problems, these are probably not your best option.  No Primary Care Doctor: - Call Health Connect at  [phone] - they can help you locate a  primary care doctor that  accepts your insurance, provides certain services, etc. - Physician Referral Service- [phone]  Chronic Pain Problems: Organization         Address  Phone   Notes  [facility]  [phone]) [phone] Patients need to be referred by their primary care doctor.   Medication Assistance: Organization         Address  Phone   Notes  [facility] [address] [place], [place] [address] [phone]) [phone] --Must be a resident of [facility] [place] -- Must have NO insurance coverage whatsoever (no Medicaid/ Medicare, etc.) -- The pt. MUST have a primary care doctor that directs their care regularly and follows them in the community   MedAssist  [phone]   [employer]  [phone]) [phone]    Agencies that provide inexpensive medical care: Organization         Address  Phone   Notes  [facility]  [phone]) [phone]   [facility]    [phone]) [phone]   [facility] [address] [place], [place] [address] [phone]) [phone]   [facility] [address], [place] [phone]   [facility]anned Parenthood    [phone](336) [phone]   [facility]    [phone]   [facility]  [address], [place] Phone:  [phone], Fax:  [phone])  [phone] Hours of Operation:  9 am - 6 pm, M-F.  Also accepts Medicaid/Medicare and self-pay.  [facility] for [place], [address], [place] Phone: [phone], Fax: [phone]. Hours of Operation:  8:30 am - 5:30 pm, M-F.  Also accepts Medicaid and self-pay.  [facility] [place] [address], [place] Phone: [phone]   [place], [place], [place] [phone], Ext. [phone] Mondays & Thursdays: 7-9 AM.  First 15 patients are seen on a first come, first serve basis.    [place]  Providers:  Organization         Address  Phone   Notes  [facility] [address], [place] [phone] Also accepts self-pay patients.  [facility] [address] [place], [place]  [phone]   [place], [address], [place] [phone]   [facility] [address], [place] [phone]   Lucianne Lei [address], [place]   [phone] Only accepts [place] Access [place] patients after they have their name applied to their card.   Self-Pay (no insurance) in [facility]:  Organization         Address  Phone   Notes  Sickle Cell Patients, [facility] Internal [facility] [place] [phone]   [facility] [place] [phone]   [facility] [place]  [place], [place], [place] [phone]   [facility]/Dr. Osei-Bonsu  [address], [place] or [address], [place] [phone] Phone number for both [place] and [place] locations is the same.  Urgent Medical and [facility] [address] [place] [phone]   [facility] [address] [place] or [address] [phone]   [facility] [address], [place] [phone], phone; [phone], fax Sees patients 1st and 3rd Saturday of every month.  Must not qualify for public or private insurance (i.e. Medicaid, Medicare, [place] Health Choice, Veterans' Benefits)  Household income should be no more than 200% of the poverty level The clinic cannot treat you if you are pregnant or think you are pregnant  Sexually transmitted diseases are not treated at the clinic.    Dental Care: Organization         Address  Phone  Notes  [facility] [place] [phone] Accepts children up to age 33 who are enrolled in [place] or [place]; pregnant women with a Medicaid card; and children who have applied for Medicaid or [place] Health Choice, but were declined, whose parents can pay a reduced fee at time of service.  [facility] Department of [facility]  [address], [place] [phone] Accepts children up to age 77 who are enrolled in [place] or [place]; pregnant women with a Medicaid card; and children who have applied for Medicaid or  Health Choice, but were declined, whose parents can pay a reduced fee at time of service.  [facility] Adult [facility] Access PROGRAM  [place] [phone] Patients are seen by appointment only. Walk-ins are not accepted. [place]  will see patients 14 years of age and older. Monday - Tuesday (8am-5pm) Most Wednesdays (8:30-5pm) $30 per visit, cash only  [facility] Adult Dental Access PROGRAM  [address], [facility] [phone] Patients are seen by appointment only. Walk-ins are not accepted. [place] will see patients 45 years of age and older. One Wednesday Evening (Monthly: Volunteer Based).  $30 per visit, cash only  [place]  [phone] for adults; Children under age 38, call Graduate Pediatric Dentistry at [phone]. Children aged 29-14, please call [phone] to request a pediatric application.  Dental services are provided in all areas of dental care including fillings, crowns and bridges, complete and partial dentures, implants, gum treatment, root canals, and extractions. Preventive care is also provided. Treatment is provided to both adults and children. Patients are selected via a lottery and there is often a waiting list.   [facility] [address], [place]  [phone] [email or web address]   [facility]  [address] [place], [place] [phone], Ext. [phone] Second and Fourth Thursday of each month, opens at 6:30 AM; Clinic ends at 9 AM.  Patients are seen on a first-come first-served basis, and a limited number are seen during each clinic.   [facility]  [address] Hillard Danker [place], [place] [phone]   Eligibility Requirements You must have lived in [place], [place], or [place] counties for at least the last three months.   You cannot be eligible for state or federal sponsored [employer], including [employer], [place], or [employer].   You generally cannot be eligible for healthcare insurance through your employer.    How to apply: Eligibility screenings are held every Tuesday and Wednesday afternoon from 1:00 pm until 4:00 pm. You do not need an appointment for the interview!  [facility] [address] [place], [place]   [place]  Welcome Department  [place]  [phone]    Behavioral Health Resources in the Community: Intensive Outpatient Programs Organization         Address  Phone  Notes  [place]. [address] [place], [place] [phone]   [facility] Outpatient [address] [place], [place]   ADS: Alcohol & Drug Svcs [address] [place], [place]   [place] [address]  [place], [place] or [phone]   Substance Abuse Resources Organization         Address  Phone  Notes  Alcohol and Drug Services  [phone]   [place]  [phone]   The [place]   Chinita Pester  [phone]   Residential & Outpatient Substance Abuse Program  [phone]   Psychological Services Organization         Address  Phone  Notes  [facility] [place]  [place]  [phone]   [place] [address], [place] or [phone]    Mobile Crisis Teams Organization         Address  Phone  Notes  [facility], Mobile Crisis Care Unit  [phone]   [facility]ive Psychotherapeutic Services  [address] [place], [place]   [facility] [address] [place] [phone]    Self-Help/Support Groups Organization  Address  Phone             Notes  [place]. of [place] - variety of support groups  [place]ien Call for more information  Narcotics Anonymous (NA), [facility] [address], [employer] [place]  2 meetings at this location   Special educational needs teacher         Address  Phone  Notes  [facility] Residential Treatment [place],    [place]  [phone]   [facility]  [address] [place] [address], [place], [place]   [place], [place] [phone] Admissions: 8am-3pm M-F  Incentives Substance [place] [address],    [place], [place] [phone]   [facility] [address] [place], [place], [place]   [facility] Closed [address],  [place], [place]   [facility] - Intensive Outpatient [address], [place], [place]   [facility] [place].,  [place], [place] [phone] or [phone]   [facility] Services (RTS) [address], [place], [place] Accepts Medicaid  [facility] [address],  [place] [phone] Substance Abuse/Addiction Treatment   [facility] Organization         Address  Phone  Notes  [facility]  [phone]   Domenic Schwab, PhD [address] Arlis [place], [place]   [phone] or [phone]    [address] [facility] [place], [place] [phone]   [facility] [address], [place], [place] [phone] Insurance/Medicaid/sponsorship through [facility] and Families [address], [address] [place]                                    [place], [place] [phone] [place] [address] [place], [place] [phone]    Dr. Adele Schilder  [phone]   [facility] Dept. 1) [address] [place] 2) [place] 3)  [place] [address], [place] [phone]  [phone]   [place] [phone] or [phone] (After Hours)

## 2013-08-06 LAB — CULTURE, GROUP A STREP

## 2013-08-09 NOTE — ED Provider Notes (Signed)
Medical screening examination/treatment/procedure(s) were performed by non-physician practitioner and as supervising physician I was immediately available for consultation/collaboration.   EKG Interpretation None       Dawnya Grams, MD 08/09/13 1440 

## 2014-08-17 ENCOUNTER — Emergency Department (HOSPITAL_COMMUNITY)
Admission: EM | Admit: 2014-08-17 | Discharge: 2014-08-17 | Disposition: A | Discharge status: Home / Self Care | Payer: BLUE CROSS/BLUE SHIELD | Attending: Emergency Medicine | Admitting: Emergency Medicine

## 2014-08-17 ENCOUNTER — Encounter (HOSPITAL_COMMUNITY): Payer: Self-pay | Admitting: Emergency Medicine

## 2014-08-17 DIAGNOSIS — Z79899 Other long term (current) drug therapy: Secondary | ICD-10-CM | POA: Diagnosis not present

## 2014-08-17 DIAGNOSIS — J029 Acute pharyngitis, unspecified: Secondary | ICD-10-CM | POA: Diagnosis not present

## 2014-08-17 DIAGNOSIS — Z8719 Personal history of other diseases of the digestive system: Secondary | ICD-10-CM | POA: Diagnosis not present

## 2014-08-17 DIAGNOSIS — Z880 Allergy status to penicillin: Secondary | ICD-10-CM | POA: Insufficient documentation

## 2014-08-17 DIAGNOSIS — R21 Rash and other nonspecific skin eruption: Secondary | ICD-10-CM | POA: Insufficient documentation

## 2014-08-17 LAB — RAPID STREP SCREEN (MED CTR MEBANE ONLY): Streptococcus, Group A Screen (Direct): NEGATIVE

## 2014-08-17 MED ORDER — PREDNISONE 20 MG PO TABS
60.0000 mg | ORAL_TABLET | Freq: Once | ORAL | Status: AC
  Administered 2014-08-17: 60 mg via ORAL
  Filled 2014-08-17: qty 3

## 2014-08-17 MED ORDER — PREDNISONE 50 MG PO TABS: ORAL_TABLET | ORAL | Status: DC

## 2014-08-17 NOTE — ED Notes (Signed)
Pt alert, oriented, and ambulatory upon DC. She was advised to follow up with with a PCP and a number was provided to obtain one.  She was educated about rashes and medication prednisone. She verbalizes understanding.

## 2014-08-17 NOTE — ED Notes (Signed)
MD Wickline at bedside. 

## 2014-08-17 NOTE — ED Notes (Signed)
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  C/o rash to bilateral arms x 1 wk.  States that she gets headaches when she is out in the sun.  States that her face feels swollen.  States that her wisdom teeth are coming in and she cannot get them pulled till next year.  Also c/o sore throat x 3 days.  States she vomited yesterday.

## 2014-08-17 NOTE — ED Provider Notes (Signed)
CSN: [id number]     Arrival date & time 08/17/14  1807 History   First MD Initiated Contact with Patient 08/17/14 2048     Chief Complaint  Patient presents with  . Multiple Complaints      The history is provided by the patient.  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 Bronchitis   . Pancreatitis    Past Surgical History  Procedure Laterality Date  . Ovarian cyst surgery     No family history on file. History  Substance Use Topics  . Smoking status: Never Smoker   . Smokeless tobacco: Not on file  . Alcohol Use: No   OB History    No data available     Review of Systems  Constitutional: Positive for fever.  HENT: Positive for sore throat.   Gastrointestinal: Positive for nausea.  Skin: Positive for rash.  All other systems reviewed and are negative.     Allergies  Penicillins  Home Medications   Prior to Admission medications   Medication Sig Start Date End Date Taking? Authorizing Provider  acetaminophen (TYLENOL) 500 MG tablet Take 500 mg by mouth every 6 (six) hours as needed for moderate pain.   Yes Historical Provider, MD  cetirizine-pseudoephedrine (ZYRTEC-D) 5-120 MG per tablet Take 1 tablet by mouth 2 (two) times daily. Patient taking differently: Take 1 tablet by mouth daily as needed for allergies.  06/09/13  Yes Teressa Lower, NP  ibuprofen (ADVIL,MOTRIN) 200 MG tablet Take 400 mg by mouth every 6 (six) hours as needed for fever or moderate pain.    Historical Provider, MD  predniSONE (DELTASONE) 50 MG tablet 1 tablet PO QD X4 days 08/17/14   Zadie Rhine, MD   BP 109/67  mmHg  Pulse 91  Temp(Src) 98.9 F (37.2 C) (Oral)  Resp 16  SpO2 100% Physical Exam CONSTITUTIONAL: Well developed/well nourished HEAD: Normocephalic/atraumatic EYES: EOMI/PERRL ENMT: Mucous membranes moist, uvula midline, no erythema/exudates noted No dental abscess noted, no trismus noted NECK: supple no meningeal signs SPINE/BACK:entire spine nontender CV: S1/S2 noted, no murmurs/rubs/gallops noted LUNGS: Lungs are clear to auscultation bilaterally, no apparent distress ABDOMEN: soft, nontender, no rebound or guarding, bowel sounds noted throughout abdomen NEURO: Pt is awake/alert/appropriate, moves all extremitiesx4.  No facial droop.   EXTREMITIES: pulses normal/equal, full ROM, rash noted to bilateral UE SKIN: warm, color normal PSYCH: no abnormalities of mood noted, alert and oriented to situation  ED Course  Procedures (including critical care time) Labs Review Labs Reviewed  RAPID STREP SCREEN (NOT AT [facility])  CULTURE, GROUP A STREP     Rash appears c/w allergic reaction Advised use of benadryl and will start prednisone  MDM   Final diagnoses:  Sore throat  Rash    Nursing notes including past medical history and social history reviewed and considered in documentation Labs/vital reviewed myself and considered during evaluation     Zadie Rhine, MD 08/17/14 2215

## 2014-08-17 NOTE — ED Notes (Addendum)
Pt reports that she has been using allergy medication and tylenol  Without relief. She reports having sore throat and headache. Pt does not have a PCP and will need resources.

## 2014-08-20 LAB — CULTURE, GROUP A STREP: Strep A Culture: NEGATIVE

## 2014-08-24 ENCOUNTER — Encounter (HOSPITAL_COMMUNITY): Payer: Self-pay | Admitting: Emergency Medicine

## 2014-08-24 ENCOUNTER — Emergency Department (HOSPITAL_COMMUNITY)
Admission: EM | Admit: 2014-08-24 | Discharge: 2014-08-24 | Disposition: A | Discharge status: Home / Self Care | Payer: Worker's Compensation | Attending: Emergency Medicine | Admitting: Emergency Medicine

## 2014-08-24 DIAGNOSIS — Z880 Allergy status to penicillin: Secondary | ICD-10-CM | POA: Diagnosis not present

## 2014-08-24 DIAGNOSIS — Z8709 Personal history of other diseases of the respiratory system: Secondary | ICD-10-CM | POA: Diagnosis not present

## 2014-08-24 DIAGNOSIS — Y92410 Unspecified street and highway as the place of occurrence of the external cause: Secondary | ICD-10-CM | POA: Insufficient documentation

## 2014-08-24 DIAGNOSIS — Y9389 Activity, other specified: Secondary | ICD-10-CM | POA: Diagnosis not present

## 2014-08-24 DIAGNOSIS — Z79899 Other long term (current) drug therapy: Secondary | ICD-10-CM | POA: Insufficient documentation

## 2014-08-24 DIAGNOSIS — Z8719 Personal history of other diseases of the digestive system: Secondary | ICD-10-CM | POA: Insufficient documentation

## 2014-08-24 DIAGNOSIS — S3992XA Unspecified injury of lower back, initial encounter: Secondary | ICD-10-CM | POA: Diagnosis not present

## 2014-08-24 DIAGNOSIS — Y998 Other external cause status: Secondary | ICD-10-CM | POA: Diagnosis not present

## 2014-08-24 DIAGNOSIS — M545 Low back pain: Secondary | ICD-10-CM

## 2014-08-24 MED ORDER — IBUPROFEN 800 MG PO TABS: 800.0000 mg | ORAL_TABLET | Freq: Three times a day (TID) | ORAL | Status: DC

## 2014-08-24 MED ORDER — METHOCARBAMOL 500 MG PO TABS: 500.0000 mg | ORAL_TABLET | Freq: Two times a day (BID) | ORAL | Status: DC

## 2014-08-24 MED ORDER — IBUPROFEN 800 MG PO TABS
800.0000 mg | ORAL_TABLET | Freq: Three times a day (TID) | ORAL | Status: DC
Start: 2014-08-24 — End: 2014-08-24

## 2014-08-24 NOTE — Discharge Instructions (Signed)
Back Pain, Adult Low back pain is very common. About 1 in 5 people have back pain.The cause of low back pain is rarely dangerous. The pain often gets better over time.About half of people with a sudden onset of back pain feel better in just 2 weeks. About 8 in 10 people feel better by 6 weeks.  CAUSES Some common causes of back pain include:  Strain of the muscles or ligaments supporting the spine.  Wear and tear (degeneration) of the spinal discs.  Arthritis.  Direct injury to the back. DIAGNOSIS Most of the time, the direct cause of low back pain is not known.However, back pain can be treated effectively even when the exact cause of the pain is unknown.Answering your caregiver's questions about your overall health and symptoms is one of the most accurate ways to make sure the cause of your pain is not dangerous. If your caregiver needs more information, he or she may order lab work or imaging tests (X-rays or MRIs).However, even if imaging tests show changes in your back, this usually does not require surgery. HOME CARE INSTRUCTIONS For many people, back pain returns.Since low back pain is rarely dangerous, it is often a condition that people can learn to manageon their own.   Remain active. It is stressful on the back to sit or stand in one place. Do not sit, drive, or stand in one place for more than 30 minutes at a time. Take short walks on level surfaces as soon as pain allows.Try to increase the length of time you walk each day.  Do not stay in bed.Resting more than 1 or 2 days can delay your recovery.  Do not avoid exercise or work.Your body is made to move.It is not dangerous to be active, even though your back may hurt.Your back will likely heal faster if you return to being active before your pain is gone.  Pay attention to your body when you bend and lift. Many people have less discomfortwhen lifting if they bend their knees, keep the load close to their bodies,and  avoid twisting. Often, the most comfortable positions are those that put less stress on your recovering back.  Find a comfortable position to sleep. Use a firm mattress and lie on your side with your knees slightly bent. If you lie on your back, put a pillow under your knees.  Only take over-the-counter or prescription medicines as directed by your caregiver. Over-the-counter medicines to reduce pain and inflammation are often the most helpful.Your caregiver may prescribe muscle relaxant drugs.These medicines help dull your pain so you can more quickly return to your normal activities and healthy exercise.  Put ice on the injured area.  Put ice in a plastic bag.  Place a towel between your skin and the bag.  Leave the ice on for 15-20 minutes, 03-04 times a day for the first 2 to 3 days. After that, ice and heat may be alternated to reduce pain and spasms.  Ask your caregiver about trying back exercises and gentle massage. This may be of some benefit.  Avoid feeling anxious or stressed.Stress increases muscle tension and can worsen back pain.It is important to recognize when you are anxious or stressed and learn ways to manage it.Exercise is a great option. SEEK MEDICAL CARE IF:  You have pain that is not relieved with rest or medicine.  You have pain that does not improve in 1 week.  You have new symptoms.  You are generally not feeling well. SEEK   IMMEDIATE MEDICAL CARE IF:   You have pain that radiates from your back into your legs.  You develop new bowel or bladder control problems.  You have unusual weakness or numbness in your arms or legs.  You develop nausea or vomiting.  You develop abdominal pain.  You feel faint. Document Released: 01/05/2005 Document Revised: 07/07/2011 Document Reviewed: 05/09/2013 ExitCare Patient Information 2015 [employer]. This information is not intended to replace advice given to you by your health care provider. Make sure you  discuss any questions you have with your health care provider.  
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
## 2014-08-24 NOTE — ED Notes (Signed)
Pt unable to sign for d/c instructions, trackpad in room not working.  Pt verbalized understanding.
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
## 2014-09-05 ENCOUNTER — Ambulatory Visit (INDEPENDENT_AMBULATORY_CARE_PROVIDER_SITE_OTHER): Admitting: Family Medicine

## 2014-09-05 ENCOUNTER — Ambulatory Visit

## 2014-09-05 ENCOUNTER — Ambulatory Visit: Payer: Worker's Compensation

## 2014-09-05 VITALS — BP 112/69 | HR 83 | Temp 98.7°F | Resp 18 | Ht 63.0 in | Wt 138.0 lb

## 2014-09-05 DIAGNOSIS — M62838 Other muscle spasm: Secondary | ICD-10-CM

## 2014-09-05 DIAGNOSIS — M549 Dorsalgia, unspecified: Secondary | ICD-10-CM | POA: Diagnosis not present

## 2014-09-05 MED ORDER — CYCLOBENZAPRINE HCL 10 MG PO TABS: 10.0000 mg | ORAL_TABLET | Freq: Three times a day (TID) | ORAL | Status: DC | PRN

## 2014-09-05 NOTE — Patient Instructions (Signed)
Continue to take the muscle-relaxants and anti-inflammatory.  Hydrate well.  Please do these stretches daily.  We will continue the work restrictions.   Low Back Sprain with Rehab  A sprain is an injury in which a ligament is torn. The ligaments of the lower back are vulnerable to sprains. However, they are strong and require great force to be injured. These ligaments are important for stabilizing the spinal column. Sprains are classified into three categories. Grade 1 sprains cause pain, but the tendon is not lengthened. Grade 2 sprains include a lengthened ligament, due to the ligament being stretched or partially ruptured. With grade 2 sprains there is still function, although the function may be decreased. Grade 3 sprains involve a complete tear of the tendon or muscle, and function is usually impaired. SYMPTOMS   Severe pain in the lower back.  Sometimes, a feeling of a "pop," "snap," or tear, at the time of injury.  Tenderness and sometimes swelling at the injury site.  Uncommonly, bruising (contusion) within 48 hours of injury.  Muscle spasms in the back. CAUSES  Low back sprains occur when a force is placed on the ligaments that is greater than they can handle. Common causes of injury include:  Performing a stressful act while off-balance.  Repetitive stressful activities that involve movement of the lower back.  Direct hit (trauma) to the lower back. RISK INCREASES WITH:  Contact sports (football, wrestling).  Collisions (major skiing accidents).  Sports that require throwing or lifting (baseball, weightlifting).  Sports involving twisting of the spine (gymnastics, diving, tennis, golf).  Poor strength and flexibility.  Inadequate protection.  Previous back injury or surgery (especially fusion). PREVENTION  Wear properly fitted and padded protective equipment.  Warm up and stretch properly before activity.  Allow for adequate recovery between  workouts.  Maintain physical fitness:  Strength, flexibility, and endurance.  Cardiovascular fitness.  Maintain a healthy body weight. PROGNOSIS  If treated properly, low back sprains usually heal with non-surgical treatment. The length of time for healing depends on the severity of the injury.  RELATED COMPLICATIONS   Recurring symptoms, resulting in a chronic problem.  Chronic inflammation and pain in the low back.  Delayed healing or resolution of symptoms, especially if activity is resumed too soon.  Prolonged impairment.  Unstable or arthritic joints of the low back. TREATMENT  Treatment first involves the use of ice and medicine, to reduce pain and inflammation. The use of strengthening and stretching exercises may help reduce pain with activity. These exercises may be performed at home or with a therapist. Severe injuries may require referral to a therapist for further evaluation and treatment, such as ultrasound. Your caregiver may advise that you wear a back brace or corset, to help reduce pain and discomfort. Often, prolonged bed rest results in greater harm then benefit. Corticosteroid injections may be recommended. However, these should be reserved for the most serious cases. It is important to avoid using your back when lifting objects. At night, sleep on your back on a firm mattress, with a pillow placed under your knees. If non-surgical treatment is unsuccessful, surgery may be needed.  MEDICATION   If pain medicine is needed, nonsteroidal anti-inflammatory medicines (aspirin and ibuprofen), or other minor pain relievers (acetaminophen), are often advised.  Do not take pain medicine for 7 days before surgery.  Prescription pain relievers may be given, if your caregiver thinks they are needed. Use only as directed and only as much as you need.  Ointments applied  to the skin may be helpful.  Corticosteroid injections may be given by your caregiver. These injections  should be reserved for the most serious cases, because they may only be given a certain number of times. HEAT AND COLD  Cold treatment (icing) should be applied for 10 to 15 minutes every 2 to 3 hours for inflammation and pain, and immediately after activity that aggravates your symptoms. Use ice packs or an ice massage.  Heat treatment may be used before performing stretching and strengthening activities prescribed by your caregiver, physical therapist, or athletic trainer. Use a heat pack or a warm water soak. SEEK MEDICAL CARE IF:   Symptoms get worse or do not improve in 2 to 4 weeks, despite treatment.  You develop numbness or weakness in either leg.  You lose bowel or bladder function.  Any of the following occur after surgery: fever, increased pain, swelling, redness, drainage of fluids, or bleeding in the affected area.  New, unexplained symptoms develop. (Drugs used in treatment may produce side effects.) EXERCISES  RANGE OF MOTION (ROM) AND STRETCHING EXERCISES - Low Back Sprain Most people with lower back pain will find that their symptoms get worse with excessive bending forward (flexion) or arching at the lower back (extension). The exercises that will help resolve your symptoms will focus on the opposite motion.  Your physician, physical therapist or athletic trainer will help you determine which exercises will be most helpful to resolve your lower back pain. Do not complete any exercises without first consulting with your caregiver. Discontinue any exercises which make your symptoms worse, until you speak to your caregiver. If you have pain, numbness or tingling which travels down into your buttocks, leg or foot, the goal of the therapy is for these symptoms to move closer to your back and eventually resolve. Sometimes, these leg symptoms will get better, but your lower back pain may worsen. This is often an indication of progress in your rehabilitation. Be very alert to any  changes in your symptoms and the activities in which you participated in the 24 hours prior to the change. Sharing this information with your caregiver will allow him or her to most efficiently treat your condition. These exercises may help you when beginning to rehabilitate your injury. Your symptoms may resolve with or without further involvement from your physician, physical therapist or athletic trainer. While completing these exercises, remember:   Restoring tissue flexibility helps normal motion to return to the joints. This allows healthier, less painful movement and activity.  An effective stretch should be held for at least 30 seconds.  A stretch should never be painful. You should only feel a gentle lengthening or release in the stretched tissue. FLEXION RANGE OF MOTION AND STRETCHING EXERCISES: STRETCH - Flexion, Single Knee to Chest   Lie on a firm bed or floor with both legs extended in front of you.  Keeping one leg in contact with the floor, bring your opposite knee to your chest. Hold your leg in place by either grabbing behind your thigh or at your knee.  Pull until you feel a gentle stretch in your low back. Hold __________ seconds.  Slowly release your grasp and repeat the exercise with the opposite side. Repeat __________ times. Complete this exercise __________ times per day.  STRETCH - Flexion, Double Knee to Chest  Lie on a firm bed or floor with both legs extended in front of you.  Keeping one leg in contact with the floor, bring your opposite  knee to your chest.  Tense your stomach muscles to support your back and then lift your other knee to your chest. Hold your legs in place by either grabbing behind your thighs or at your knees.  Pull both knees toward your chest until you feel a gentle stretch in your low back. Hold __________ seconds.  Tense your stomach muscles and slowly return one leg at a time to the floor. Repeat __________ times. Complete this  exercise __________ times per day.  STRETCH - Low Trunk Rotation  Lie on a firm bed or floor. Keeping your legs in front of you, bend your knees so they are both pointed toward the ceiling and your feet are flat on the floor.  Extend your arms out to the side. This will stabilize your upper body by keeping your shoulders in contact with the floor.  Gently and slowly drop both knees together to one side until you feel a gentle stretch in your low back. Hold for __________ seconds.  Tense your stomach muscles to support your lower back as you bring your knees back to the starting position. Repeat the exercise to the other side. Repeat __________ times. Complete this exercise __________ times per day  EXTENSION RANGE OF MOTION AND FLEXIBILITY EXERCISES: STRETCH - Extension, Prone on Elbows   Lie on your stomach on the floor, a bed will be too soft. Place your palms about shoulder width apart and at the height of your head.  Place your elbows under your shoulders. If this is too painful, stack pillows under your chest.  Allow your body to relax so that your hips drop lower and make contact more completely with the floor.  Hold this position for __________ seconds.  Slowly return to lying flat on the floor. Repeat __________ times. Complete this exercise __________ times per day.  RANGE OF MOTION - Extension, Prone Press Ups  Lie on your stomach on the floor, a bed will be too soft. Place your palms about shoulder width apart and at the height of your head.  Keeping your back as relaxed as possible, slowly straighten your elbows while keeping your hips on the floor. You may adjust the placement of your hands to maximize your comfort. As you gain motion, your hands will come more underneath your shoulders.  Hold this position __________ seconds.  Slowly return to lying flat on the floor. Repeat __________ times. Complete this exercise __________ times per day.  RANGE OF MOTION- Quadruped,  Neutral Spine   Assume a hands and knees position on a firm surface. Keep your hands under your shoulders and your knees under your hips. You may place padding under your knees for comfort.  Drop your head and point your tailbone toward the ground below you. This will round out your lower back like an angry cat. Hold this position for __________ seconds.  Slowly lift your head and release your tail bone so that your back sags into a large arch, like an old horse.  Hold this position for __________ seconds.  Repeat this until you feel limber in your low back.  Now, find your "sweet spot." This will be the most comfortable position somewhere between the two previous positions. This is your neutral spine. Once you have found this position, tense your stomach muscles to support your low back.  Hold this position for __________ seconds. Repeat __________ times. Complete this exercise __________ times per day.  STRENGTHENING EXERCISES - Low Back Sprain These exercises may help you  when beginning to rehabilitate your injury. These exercises should be done near your "sweet spot." This is the neutral, low-back arch, somewhere between fully rounded and fully arched, that is your least painful position. When performed in this safe range of motion, these exercises can be used for people who have either a flexion or extension based injury. These exercises may resolve your symptoms with or without further involvement from your physician, physical therapist or athletic trainer. While completing these exercises, remember:   Muscles can gain both the endurance and the strength needed for everyday activities through controlled exercises.  Complete these exercises as instructed by your physician, physical therapist or athletic trainer. Increase the resistance and repetitions only as guided.  You may experience muscle soreness or fatigue, but the pain or discomfort you are trying to eliminate should never worsen  during these exercises. If this pain does worsen, stop and make certain you are following the directions exactly. If the pain is still present after adjustments, discontinue the exercise until you can discuss the trouble with your caregiver. STRENGTHENING - Deep Abdominals, Pelvic Tilt   Lie on a firm bed or floor. Keeping your legs in front of you, bend your knees so they are both pointed toward the ceiling and your feet are flat on the floor.  Tense your lower abdominal muscles to press your low back into the floor. This motion will rotate your pelvis so that your tail bone is scooping upwards rather than pointing at your feet or into the floor. With a gentle tension and even breathing, hold this position for __________ seconds. Repeat __________ times. Complete this exercise __________ times per day.  STRENGTHENING - Abdominals, Crunches   Lie on a firm bed or floor. Keeping your legs in front of you, bend your knees so they are both pointed toward the ceiling and your feet are flat on the floor. Cross your arms over your chest.  Slightly tip your chin down without bending your neck.  Tense your abdominals and slowly lift your trunk high enough to just clear your shoulder blades. Lifting higher can put excessive stress on the lower back and does not further strengthen your abdominal muscles.  Control your return to the starting position. Repeat __________ times. Complete this exercise __________ times per day.  STRENGTHENING - Quadruped, Opposite UE/LE Lift   Assume a hands and knees position on a firm surface. Keep your hands under your shoulders and your knees under your hips. You may place padding under your knees for comfort.  Find your neutral spine and gently tense your abdominal muscles so that you can maintain this position. Your shoulders and hips should form a rectangle that is parallel with the floor and is not twisted.  Keeping your trunk steady, lift your right hand no higher  than your shoulder and then your left leg no higher than your hip. Make sure you are not holding your breath. Hold this position for __________ seconds.  Continuing to keep your abdominal muscles tense and your back steady, slowly return to your starting position. Repeat with the opposite arm and leg. Repeat __________ times. Complete this exercise __________ times per day.  STRENGTHENING - Abdominals and Quadriceps, Straight Leg Raise   Lie on a firm bed or floor with both legs extended in front of you.  Keeping one leg in contact with the floor, bend the other knee so that your foot can rest flat on the floor.  Find your neutral spine, and tense your  abdominal muscles to maintain your spinal position throughout the exercise.  Slowly lift your straight leg off the floor about 6 inches for a count of 15, making sure to not hold your breath.  Still keeping your neutral spine, slowly lower your leg all the way to the floor. Repeat this exercise with each leg __________ times. Complete this exercise __________ times per day. POSTURE AND BODY MECHANICS CONSIDERATIONS - Low Back Sprain Keeping correct posture when sitting, standing or completing your activities will reduce the stress put on different body tissues, allowing injured tissues a chance to heal and limiting painful experiences. The following are general guidelines for improved posture. Your physician or physical therapist will provide you with any instructions specific to your needs. While reading these guidelines, remember:  The exercises prescribed by your provider will help you have the flexibility and strength to maintain correct postures.  The correct posture provides the best environment for your joints to work. All of your joints have less wear and tear when properly supported by a spine with good posture. This means you will experience a healthier, less painful body.  Correct posture must be practiced with all of your activities,  especially prolonged sitting and standing. Correct posture is as important when doing repetitive low-stress activities (typing) as it is when doing a single heavy-load activity (lifting). RESTING POSITIONS Consider which positions are most painful for you when choosing a resting position. If you have pain with flexion-based activities (sitting, bending, stooping, squatting), choose a position that allows you to rest in a less flexed posture. You would want to avoid curling into a fetal position on your side. If your pain worsens with extension-based activities (prolonged standing, working overhead), avoid resting in an extended position such as sleeping on your stomach. Most people will find more comfort when they rest with their spine in a more neutral position, neither too rounded nor too arched. Lying on a non-sagging bed on your side with a pillow between your knees, or on your back with a pillow under your knees will often provide some relief. Keep in mind, being in any one position for a prolonged period of time, no matter how correct your posture, can still lead to stiffness. PROPER SITTING POSTURE In order to minimize stress and discomfort on your spine, you must sit with correct posture. Sitting with good posture should be effortless for a healthy body. Returning to good posture is a gradual process. Many people can work toward this most comfortably by using various supports until they have the flexibility and strength to maintain this posture on their own. When sitting with proper posture, your ears will fall over your shoulders and your shoulders will fall over your hips. You should use the back of the chair to support your upper back. Your lower back will be in a neutral position, just slightly arched. You may place a small pillow or folded towel at the base of your lower back for  support.  When working at a desk, create an environment that supports good, upright posture. Without extra support,  muscles tire, which leads to excessive strain on joints and other tissues. Keep these recommendations in mind: CHAIR:  A chair should be able to slide under your desk when your back makes contact with the back of the chair. This allows you to work closely.  The chair's height should allow your eyes to be level with the upper part of your monitor and your hands to be slightly lower than  your elbows. BODY POSITION  Your feet should make contact with the floor. If this is not possible, use a foot rest.  Keep your ears over your shoulders. This will reduce stress on your neck and low back. INCORRECT SITTING POSTURES  If you are feeling tired and unable to assume a healthy sitting posture, do not slouch or slump. This puts excessive strain on your back tissues, causing more damage and pain. Healthier options include:  Using more support, like a lumbar pillow.  Switching tasks to something that requires you to be upright or walking.  Talking a brief walk.  Lying down to rest in a neutral-spine position. PROLONGED STANDING WHILE SLIGHTLY LEANING FORWARD  When completing a task that requires you to lean forward while standing in one place for a long time, place either foot up on a stationary 2-4 inch high object to help maintain the best posture. When both feet are on the ground, the lower back tends to lose its slight inward curve. If this curve flattens (or becomes too large), then the back and your other joints will experience too much stress, tire more quickly, and can cause pain. CORRECT STANDING POSTURES Proper standing posture should be assumed with all daily activities, even if they only take a few moments, like when brushing your teeth. As in sitting, your ears should fall over your shoulders and your shoulders should fall over your hips. You should keep a slight tension in your abdominal muscles to brace your spine. Your tailbone should point down to the ground, not behind your body,  resulting in an over-extended swayback posture.  INCORRECT STANDING POSTURES  Common incorrect standing postures include a forward head, locked knees and/or an excessive swayback. WALKING Walk with an upright posture. Your ears, shoulders and hips should all line-up. PROLONGED ACTIVITY IN A FLEXED POSITION When completing a task that requires you to bend forward at your waist or lean over a low surface, try to find a way to stabilize 3 out of 4 of your limbs. You can place a hand or elbow on your thigh or rest a knee on the surface you are reaching across. This will provide you more stability, so that your muscles do not tire as quickly. By keeping your knees relaxed, or slightly bent, you will also reduce stress across your lower back. CORRECT LIFTING TECHNIQUES DO :  Assume a wide stance. This will provide you more stability and the opportunity to get as close as possible to the object which you are lifting.  Tense your abdominals to brace your spine. Bend at the knees and hips. Keeping your back locked in a neutral-spine position, lift using your leg muscles. Lift with your legs, keeping your back straight.  Test the weight of unknown objects before attempting to lift them.  Try to keep your elbows locked down at your sides in order get the best strength from your shoulders when carrying an object.  Always ask for help when lifting heavy or awkward objects. INCORRECT LIFTING TECHNIQUES DO NOT:   Lock your knees when lifting, even if it is a small object.  Bend and twist. Pivot at your feet or move your feet when needing to change directions.  Assume that you can safely pick up even a paperclip without proper posture. Document Released: 01/05/2005 Document Revised: 03/30/2011 Document Reviewed: 04/19/2008 [facility] Patient Information 2015 [place], [place]. This information is not intended to replace advice given to you by your health care provider. Make sure you discuss  any questions you  have with your health care provider.
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
## 2014-09-06 ENCOUNTER — Other Ambulatory Visit: Payer: Self-pay | Admitting: *Deleted

## 2014-09-10 ENCOUNTER — Ambulatory Visit (INDEPENDENT_AMBULATORY_CARE_PROVIDER_SITE_OTHER): Admitting: Physician Assistant

## 2014-09-10 VITALS — BP 102/64 | HR 71 | Temp 97.9°F | Resp 16 | Ht 64.0 in | Wt 135.2 lb

## 2014-09-10 DIAGNOSIS — R109 Unspecified abdominal pain: Secondary | ICD-10-CM | POA: Diagnosis not present

## 2014-09-10 DIAGNOSIS — M545 Low back pain: Secondary | ICD-10-CM

## 2014-09-10 DIAGNOSIS — M549 Dorsalgia, unspecified: Secondary | ICD-10-CM

## 2014-09-10 DIAGNOSIS — G729 Myopathy, unspecified: Secondary | ICD-10-CM

## 2014-09-10 DIAGNOSIS — M546 Pain in thoracic spine: Secondary | ICD-10-CM

## 2014-09-10 DIAGNOSIS — M6289 Other specified disorders of muscle: Secondary | ICD-10-CM

## 2014-09-10 LAB — POCT URINALYSIS DIPSTICK
Bilirubin, UA: NEGATIVE
Glucose, UA: NEGATIVE
Leukocytes, UA: NEGATIVE
Nitrite, UA: NEGATIVE
PH UA: 6
PROTEIN UA: NEGATIVE
RBC UA: NEGATIVE
Spec Grav, UA: 1.025
UROBILINOGEN UA: 0.2

## 2014-09-10 LAB — POCT UA - MICROSCOPIC ONLY
Bacteria, U Microscopic: NEGATIVE
CASTS, UR, LPF, POC: NEGATIVE
CRYSTALS, UR, HPF, POC: NEGATIVE
Mucus, UA: POSITIVE
RBC, URINE, MICROSCOPIC: NEGATIVE
YEAST UA: NEGATIVE

## 2014-09-10 LAB — CK: CK TOTAL: 113 U/L (ref 7–177)

## 2014-09-10 MED ORDER — MELOXICAM 15 MG PO TABS: 15.0000 mg | ORAL_TABLET | Freq: Every day | ORAL | Status: DC

## 2014-09-10 NOTE — Progress Notes (Deleted)
[facility] [address] [place] [address] [phone]  Date:  09/10/2014   Name:  Rebecca Ortiz   DOB:  1990-12-25   MRN:  [id number]  PCP:  PROVIDER NOT IN SYSTEM    History of Present Illness:  Rebecca Ortiz is a 24 y.o. female patient who present to [facility]     There are no active problems to display for this patient.   Past Medical History  Diagnosis Date  . Bronchitis   . Pancreatitis     Past Surgical History  Procedure Laterality Date  . Ovarian cyst surgery      Social History  Substance Use Topics  . Smoking status: Never Smoker   . Smokeless tobacco: None  . Alcohol Use: Yes     Comment: rarely    History reviewed. No pertinent family history.  Allergies  Allergen Reactions  . Penicillins Itching and Swelling    Facial swelling All over body rash    Medication list has been reviewed and updated.  Current Outpatient Prescriptions on File Prior to Visit  Medication Sig Dispense Refill  . cyclobenzaprine (FLEXERIL) 10 MG tablet Take 1 tablet (10 mg total) by mouth 3 (three) times daily as needed for muscle spasms. 30 tablet 0  . ibuprofen (ADVIL,MOTRIN) 800 MG tablet Take 1 tablet (800 mg total) by mouth 3 (three) times daily. 21 tablet 0  . acetaminophen (TYLENOL) 500 MG tablet Take 1,000 mg by mouth every 6 (six) hours as needed for moderate pain.     . cetirizine-pseudoephedrine (ZYRTEC-D) 5-120 MG per tablet Take 1 tablet by mouth 2 (two) times daily. (Patient not taking: Reported on 09/05/2014) 30 tablet 0  . methocarbamol (ROBAXIN) 500 MG tablet Take 1 tablet (500 mg total) by mouth 2 (two) times daily. (Patient not taking: Reported on 09/10/2014) 20 tablet 0  . predniSONE (DELTASONE) 50 MG tablet 1 tablet PO QD X4 days (Patient not taking: Reported on 08/24/2014) 4 tablet 0   No current facility-administered medications on file prior to visit.    ROS   Physical Examination: BP 102/64 mmHg  Pulse 71  Temp(Src) 97.9  F (36.6 C) (Oral)  Resp 16  Ht  (1.626 m)  Wt 135 lb 3.2 oz (61.326 kg)  BMI 23.20 kg/m2  SpO2 98%  LMP 08/19/2014 Ideal Body Weight: Weight in (lb) to have BMI = 25: 145.3  Physical Exam   Assessment and Plan:  1. Low back pain without sciatica, unspecified back pain laterality *** - AMB referral to orthopedics - meloxicam (MOBIC) 15 MG tablet; Take 1 tablet (15 mg total) by mouth daily.  Dispense: 30 tablet; Refill: 0  2. Flank pain *** - POCT UA - Microscopic Only - POCT urinalysis dipstick - meloxicam (MOBIC) 15 MG tablet; Take 1 tablet (15 mg total) by mouth daily.  Dispense: 30 tablet; Refill: 0  3. Back pain, unspecified location *** - AMB referral to orthopedics - meloxicam (MOBIC) 15 MG tablet; Take 1 tablet (15 mg total) by mouth daily.  Dispense: 30 tablet; Refill: 0  4. Bilateral thoracic back pain *** - AMB referral to orthopedics   Trena Platt, PA-C [facility] 09/10/2014 5:00 PM

## 2014-09-10 NOTE — Patient Instructions (Addendum)
Please await contact for orthopedic referral. Continue the stretches 3-4 times per day.   Please make sure that you are hydrating better.
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
## 2014-09-17 ENCOUNTER — Ambulatory Visit (INDEPENDENT_AMBULATORY_CARE_PROVIDER_SITE_OTHER): Admitting: Emergency Medicine

## 2014-09-17 VITALS — BP 100/68 | HR 83 | Temp 98.2°F | Resp 18 | Ht 64.0 in | Wt 141.0 lb

## 2014-09-17 DIAGNOSIS — M545 Low back pain: Secondary | ICD-10-CM

## 2014-09-17 DIAGNOSIS — M549 Dorsalgia, unspecified: Secondary | ICD-10-CM
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
## 2014-09-17 NOTE — Patient Instructions (Signed)

## 2014-09-22 ENCOUNTER — Ambulatory Visit (INDEPENDENT_AMBULATORY_CARE_PROVIDER_SITE_OTHER): Admitting: Emergency Medicine

## 2014-09-22 VITALS — BP 102/64 | HR 74 | Temp 97.8°F | Ht 64.0 in | Wt 135.4 lb

## 2014-09-22 DIAGNOSIS — M549 Dorsalgia, unspecified: Secondary | ICD-10-CM
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
## 2014-09-28 ENCOUNTER — Telehealth: Payer: Self-pay

## 2014-09-28 NOTE — Telephone Encounter (Signed)
Done

## 2014-09-28 NOTE — Telephone Encounter (Signed)
Patient is in the lobby requesting x-ray and out of work note through Monday.

## 2014-10-25 ENCOUNTER — Ambulatory Visit: Payer: Self-pay

## 2015-01-08 ENCOUNTER — Telehealth: Payer: Self-pay

## 2015-01-08 NOTE — Telephone Encounter (Signed)
[facility]. Will send records when payment is received.

## 2015-01-31 NOTE — Telephone Encounter (Signed)
Called and left a message on [employer] voicemail asking about the invoice that we faxed over, waiting for a call back on the records.

## 2015-02-15 ENCOUNTER — Telehealth: Payer: Self-pay

## 2015-02-15 NOTE — Telephone Encounter (Signed)
Waiting on payment of 22.25 for 33 pages from Margarette Asal. Read.

## 2015-02-19 NOTE — Telephone Encounter (Signed)
Rebecca Ortiz stated they no longer need these records because the patient has gotten attorney and all records need to be sent to him, there is a release pending for him just waiting on payment.

## 2015-03-04 DIAGNOSIS — Z0271 Encounter for disability determination: Secondary | ICD-10-CM

## 2015-03-04 NOTE — Telephone Encounter (Signed)
Payment received and records mailed to attorney on 03/04/15

## 2015-04-09 ENCOUNTER — Encounter (HOSPITAL_BASED_OUTPATIENT_CLINIC_OR_DEPARTMENT_OTHER): Payer: Self-pay

## 2015-04-09 ENCOUNTER — Emergency Department (HOSPITAL_BASED_OUTPATIENT_CLINIC_OR_DEPARTMENT_OTHER)
Admission: EM | Admit: 2015-04-09 | Discharge: 2015-04-09 | Disposition: A | Discharge status: Home / Self Care | Payer: BLUE CROSS/BLUE SHIELD | Attending: Emergency Medicine | Admitting: Emergency Medicine

## 2015-04-09 DIAGNOSIS — R197 Diarrhea, unspecified: Secondary | ICD-10-CM | POA: Insufficient documentation

## 2015-04-09 DIAGNOSIS — Z3202 Encounter for pregnancy test, result negative: Secondary | ICD-10-CM | POA: Insufficient documentation

## 2015-04-09 DIAGNOSIS — Z880 Allergy status to penicillin: Secondary | ICD-10-CM | POA: Insufficient documentation

## 2015-04-09 DIAGNOSIS — R1030 Lower abdominal pain, unspecified: Secondary | ICD-10-CM | POA: Insufficient documentation

## 2015-04-09 DIAGNOSIS — Z79899 Other long term (current) drug therapy: Secondary | ICD-10-CM | POA: Insufficient documentation

## 2015-04-09 DIAGNOSIS — Z791 Long term (current) use of non-steroidal anti-inflammatories (NSAID): Secondary | ICD-10-CM | POA: Insufficient documentation

## 2015-04-09 DIAGNOSIS — Z8719 Personal history of other diseases of the digestive system: Secondary | ICD-10-CM | POA: Insufficient documentation

## 2015-04-09 DIAGNOSIS — Z8709 Personal history of other diseases of the respiratory system: Secondary | ICD-10-CM | POA: Insufficient documentation

## 2015-04-09 DIAGNOSIS — R630 Anorexia: Secondary | ICD-10-CM | POA: Insufficient documentation

## 2015-04-09 DIAGNOSIS — R112 Nausea with vomiting, unspecified: Secondary | ICD-10-CM

## 2015-04-09 DIAGNOSIS — E860 Dehydration: Secondary | ICD-10-CM | POA: Insufficient documentation

## 2015-04-09 LAB — URINE MICROSCOPIC-ADD ON

## 2015-04-09 LAB — URINALYSIS, ROUTINE W REFLEX MICROSCOPIC
Bilirubin Urine: NEGATIVE
Glucose, UA: NEGATIVE mg/dL
Hgb urine dipstick: NEGATIVE
KETONES UR: NEGATIVE mg/dL
NITRITE: NEGATIVE
Protein, ur: NEGATIVE mg/dL
Specific Gravity, Urine: 1.028 (ref 1.005–1.030)
pH: 5.5 (ref 5.0–8.0)

## 2015-04-09 LAB — PREGNANCY, URINE: PREG TEST UR: NEGATIVE

## 2015-04-09 MED ORDER — ONDANSETRON 8 MG PO TBDP
8.0000 mg | ORAL_TABLET | Freq: Once | ORAL | Status: AC
  Administered 2015-04-09: 8 mg via ORAL
  Filled 2015-04-09: qty 1

## 2015-04-09 MED ORDER — ONDANSETRON 8 MG PO TBDP: 8.0000 mg | ORAL_TABLET | Freq: Three times a day (TID) | ORAL | Status: DC | PRN

## 2015-04-09 MED ORDER — METOCLOPRAMIDE HCL 5 MG/ML IJ SOLN
10.0000 mg | Freq: Once | INTRAMUSCULAR | Status: AC
Start: 2015-04-09 — End: 2015-04-09
  Administered 2015-04-09: 10 mg via INTRAVENOUS
  Filled 2015-04-09: qty 2

## 2015-04-09 MED ORDER — ONDANSETRON HCL 4 MG/2ML IJ SOLN
4.0000 mg | Freq: Once | INTRAMUSCULAR | Status: AC
  Administered 2015-04-09: 4 mg via INTRAVENOUS
  Filled 2015-04-09: qty 2

## 2015-04-09 MED ORDER — SODIUM CHLORIDE 0.9 % IV BOLUS (SEPSIS)
1500.0000 mL | Freq: Once | INTRAVENOUS | Status: AC
  Administered 2015-04-09: 1000 mL via INTRAVENOUS

## 2015-04-09 NOTE — ED Notes (Signed)
Pt c/o nausea/vomiting x3/diarrhea since Sunday; states thinks she may have food poison; pt c/o tired, weak, drained feeling

## 2015-04-09 NOTE — ED Provider Notes (Signed)
CSN: [id number]     Arrival date & time 04/09/15  [id number] History   First MD Initiated Contact with Patient 04/09/15 [phone]     Chief Complaint  Patient presents with  . N/V/D      HPI Patient presents to the emergency department with complaints of nausea vomiting and diarrhea over the past 24 hours.  She reports no blood in her vomit or diarrhea.  She reports mild decreased oral intake.  She denies dysuria or urinary frequency.  She reports crampy lower abdominal pain.  She denies chest pain shortness breath.  No fevers or chills.  No recent sick contacts.  She believes this may have been related to food.  Symptoms are moderate in severity   Past Medical History  Diagnosis Date  . Bronchitis   . Pancreatitis    Past Surgical History  Procedure Laterality Date  . Ovarian cyst surgery     No family history on file. Social History  Substance Use Topics  . Smoking status: Never Smoker   . Smokeless tobacco: None  . Alcohol Use: Yes     Comment: rarely   OB History    No data available     Review of Systems  All other systems reviewed and are negative.     Allergies  Penicillins  Home Medications   Prior to Admission medications   Medication Sig Start Date End Date Taking? Authorizing Provider  meloxicam (MOBIC) 15 MG tablet Take 1 tablet (15 mg total) by mouth daily. 09/10/14   Collie SiadStephanie D English, PA  methocarbamol (ROBAXIN) 500 MG tablet Take 1 tablet (500 mg total) by mouth 2 (two) times daily. 08/24/14   Elson AreasLeslie K Sofia, PA-C   BP 93/69 mmHg  Pulse 79  Temp(Src) 98.3 F (36.8 C) (Oral)  Resp 18  Ht 5\' 3"  (1.6 m)  Wt 122 lb 11.2 oz (55.656 kg)  BMI 21.74 kg/m2  SpO2 100%  LMP 02/27/2015 Physical Exam  Constitutional: She is oriented to person, place, and time. She appears well-developed and well-nourished. No distress.  HENT:  Head: Normocephalic and atraumatic.  Eyes: EOM are normal.  Neck: Normal range of motion.  Cardiovascular: Normal rate and regular  rhythm.   Pulmonary/Chest: Effort normal and breath sounds normal.  Abdominal: Soft. She exhibits no distension. There is no tenderness.  Musculoskeletal: Normal range of motion.  Neurological: She is alert and oriented to person, place, and time.  Skin: Skin is warm and dry.  Psychiatric: She has a normal mood and affect. Judgment normal.  Nursing note and vitals reviewed.   ED Course  Procedures (including critical care time) Labs Review Labs Reviewed  URINALYSIS, ROUTINE W REFLEX MICROSCOPIC (NOT AT [facility]) - Abnormal; Notable for the following:    Leukocytes, UA TRACE (*)    All other components within normal limits  URINE MICROSCOPIC-ADD ON - Abnormal; Notable for the following:    Squamous Epithelial / LPF 0-5 (*)    Bacteria, UA FEW (*)    All other components within normal limits  PREGNANCY, URINE    Imaging Review No results found. I have personally reviewed and evaluated these images and lab results as part of my medical decision-making.   EKG Interpretation None      MDM   Final diagnoses:  None    Patient is overall well-appearing.  This is likely a viral gastroenteritis.  No abdominal tenderness.  Urine pregnancy test is negative.  Patient given oral Zofran in the emergency department.  Still a slight nausea after oral antinausea medication.  IV will be placed.  IV fluids.  Reglan and Zofran now.  9:18 AM  She is tolerating oral fluids.  Discharge home with antinausea medication.  She understands return to the ER for new or worsening symptoms. She is feeling better after IV fluids.     Azalia Bilis, MD 04/09/15 [phone]

## 2015-04-09 NOTE — Discharge Instructions (Signed)

## 2015-05-22 ENCOUNTER — Other Ambulatory Visit: Payer: Self-pay | Admitting: Physician Assistant

## 2015-06-11 ENCOUNTER — Telehealth (HOSPITAL_BASED_OUTPATIENT_CLINIC_OR_DEPARTMENT_OTHER): Payer: Self-pay | Admitting: *Deleted
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
## 2018-04-07 ENCOUNTER — Emergency Department (HOSPITAL_COMMUNITY)
Admission: EM | Admit: 2018-04-07 | Discharge: 2018-04-08 | Disposition: A | Discharge status: Home / Self Care | Payer: Medicaid Other | Attending: Emergency Medicine | Admitting: Emergency Medicine

## 2018-04-07 ENCOUNTER — Encounter (HOSPITAL_COMMUNITY): Payer: Self-pay | Admitting: *Deleted

## 2018-04-07 ENCOUNTER — Emergency Department (HOSPITAL_COMMUNITY): Payer: Medicaid Other

## 2018-04-07 ENCOUNTER — Other Ambulatory Visit: Payer: Self-pay

## 2018-04-07 DIAGNOSIS — N83201 Unspecified ovarian cyst, right side: Secondary | ICD-10-CM

## 2018-04-07 DIAGNOSIS — N760 Acute vaginitis: Secondary | ICD-10-CM | POA: Insufficient documentation

## 2018-04-07 DIAGNOSIS — B9689 Other specified bacterial agents as the cause of diseases classified elsewhere: Secondary | ICD-10-CM | POA: Insufficient documentation

## 2018-04-07 LAB — COMPREHENSIVE METABOLIC PANEL
ALBUMIN: 4.3 g/dL (ref 3.5–5.0)
ALT: 21 U/L (ref 0–44)
AST: 21 U/L (ref 15–41)
Alkaline Phosphatase: 86 U/L (ref 38–126)
Anion gap: 7 (ref 5–15)
BUN: 12 mg/dL (ref 6–20)
CO2: 24 mmol/L (ref 22–32)
Calcium: 8.7 mg/dL — ABNORMAL LOW (ref 8.9–10.3)
Chloride: 106 mmol/L (ref 98–111)
Creatinine, Ser: 0.79 mg/dL (ref 0.44–1.00)
GFR calc Af Amer: >60 mL/min (ref >60)
GFR calc non Af Amer: >60 mL/min (ref >60)
GLUCOSE: 99 mg/dL (ref 70–99)
POTASSIUM: 3.7 mmol/L (ref 3.5–5.1)
Sodium: 137 mmol/L (ref 135–145)
Total Bilirubin: 0.5 mg/dL (ref 0.3–1.2)
Total Protein: 8.1 g/dL (ref 6.5–8.1)

## 2018-04-07 LAB — CBC
HCT: 39.6 % (ref 36.0–46.0)
HEMOGLOBIN: 11.9 g/dL — AB (ref 12.0–15.0)
MCH: 23.3 pg — AB (ref 26.0–34.0)
MCHC: 30.1 g/dL (ref 30.0–36.0)
MCV: 77.6 fL — ABNORMAL LOW (ref 80.0–100.0)
Platelets: 404 10*3/uL — ABNORMAL HIGH (ref 150–400)
RBC: 5.1 MIL/uL (ref 3.87–5.11)
RDW: 17.7 % — ABNORMAL HIGH (ref 11.5–15.5)
WBC: 12.8 10*3/uL — ABNORMAL HIGH (ref 4.0–10.5)
nRBC: 0 % (ref 0.0–0.2)

## 2018-04-07 LAB — URINALYSIS, ROUTINE W REFLEX MICROSCOPIC
BILIRUBIN URINE: NEGATIVE
Bacteria, UA: NONE SEEN
Glucose, UA: NEGATIVE mg/dL
Ketones, ur: NEGATIVE mg/dL
Nitrite: NEGATIVE
PROTEIN: NEGATIVE mg/dL
SPECIFIC GRAVITY, URINE: 1.029 (ref 1.005–1.030)
pH: 5 (ref 5.0–8.0)

## 2018-04-07 LAB — WET PREP, GENITAL
Sperm: NONE SEEN
TRICH WET PREP: NONE SEEN
YEAST WET PREP: NONE SEEN

## 2018-04-07 LAB — I-STAT BETA HCG BLOOD, ED (MC, WL, AP ONLY): I-stat hCG, quantitative: <5 m[IU]/mL (ref <5)

## 2018-04-07 LAB — LIPASE, BLOOD: Lipase: 46 U/L (ref 11–51)

## 2018-04-07 MED ORDER — SODIUM CHLORIDE 0.9% FLUSH: 3.0000 mL | Freq: Once | INTRAVENOUS | Status: DC

## 2018-04-07 MED ORDER — KETOROLAC TROMETHAMINE 15 MG/ML IJ SOLN
15.0000 mg | Freq: Once | INTRAMUSCULAR | Status: AC
  Administered 2018-04-07: 15 mg via INTRAVENOUS
  Filled 2018-04-07: qty 1

## 2018-04-07 NOTE — ED Notes (Signed)
Pelvic set up at the bedside.

## 2018-04-07 NOTE — ED Triage Notes (Signed)
Pt stated "I have had ovarian cysts before and had surgery to have them removed.  My ovaries started hurting again today.  [facility] and I couldn't get an appt for at least a week."

## 2018-04-07 NOTE — ED Provider Notes (Signed)
[facility]-EMERGENCY DEPT Provider Note   CSN: [id number] Arrival date & time: 04/07/18  2013    History   Chief Complaint Chief Complaint  Patient presents with   Abdominal Pain    HPI Rebecca Ortiz is a 28 y.o. female.     The history is provided by the patient and medical records. No language interpreter was used.  Abdominal Pain   Rebecca Ortiz is a 28 y.o. female who presents to the Emergency Department complaining of abdominal pain. Presents to the emergency department complaining of lower abdominal pain that started about one week ago. Pain is located across the entire lower abdomen she has associated nausea and increased national discharge. She denies any fevers, vomiting, dysuria. She is sexually active and sometimes uses protection. She has a history of similar symptoms in the past and had ovarian cysts that were terminally tumors that needed to be respected in 2013. She does have an OB/GYN in [place] but is currently visiting the area and presents to the emergency department for evaluation at this time. Past Medical History:  Diagnosis Date   Bronchitis    Pancreatitis     There are no active problems to display for this patient.   Past Surgical History:  Procedure Laterality Date   OVARIAN CYST SURGERY       OB History   No obstetric history on file.      Home Medications    Prior to Admission medications   Medication Sig Start Date End Date Taking? Authorizing Provider  meloxicam (MOBIC) 15 MG tablet Take 1 tablet (15 mg total) by mouth daily. Patient not taking: Reported on 04/07/2018 09/10/14   Trena Platt D, PA  methocarbamol (ROBAXIN) 500 MG tablet Take 1 tablet (500 mg total) by mouth 2 (two) times daily. Patient not taking: Reported on 04/07/2018 08/24/14   Elson Areas, PA-C  ondansetron (ZOFRAN ODT) 8 MG disintegrating tablet Take 1 tablet (8 mg total) by mouth every 8 (eight) hours as needed for nausea or  vomiting. Patient not taking: Reported on 04/07/2018 04/09/15   Azalia Bilis, MD    Family History No family history on file.  Social History Social History   Tobacco Use   Smoking status: Never Smoker  Substance Use Topics   Alcohol use: Yes    Comment: rarely   Drug use: No     Allergies   Patient has no known allergies.   Review of Systems Review of Systems  Gastrointestinal: Positive for abdominal pain.  All other systems reviewed and are negative.    Physical Exam Updated Vital Signs BP 110/83    Pulse 63    Temp 98 F (36.7 C) (Oral)    Resp 16    Ht  (1.6 m)    Wt 72.6 kg    LMP 04/07/2018 (Exact Date)    SpO2 98%    BMI 28.34 kg/m   Physical Exam Vitals signs and nursing note reviewed.  Constitutional:      Appearance: She is well-developed.  HENT:     Head: Normocephalic and atraumatic.  Cardiovascular:     Rate and Rhythm: Normal rate and regular rhythm.     Heart sounds: No murmur.  Pulmonary:     Effort: Pulmonary effort is normal. No respiratory distress.     Breath sounds: Normal breath sounds.  Abdominal:     Palpations: Abdomen is soft.     Tenderness: There is no guarding or rebound.  Comments: Mild suprapubic tenderness  Genitourinary:    Comments: Moderate vaginal bleeding and dark vaginal discharge.  Mild CMT and adnexal tenderness bilaterally Musculoskeletal:        General: No swelling or tenderness.  Skin:    General: Skin is warm and dry.  Neurological:     Mental Status: She is alert and oriented to person, place, and time.  Psychiatric:        Behavior: Behavior normal.      ED Treatments / Results  Labs (all labs ordered are listed, but only abnormal results are displayed) Labs Reviewed  WET PREP, GENITAL - Abnormal; Notable for the following components:      Result Value   Clue Cells Wet Prep HPF POC PRESENT (*)    WBC, Wet Prep HPF POC MANY (*)    All other components within normal limits  COMPREHENSIVE  METABOLIC PANEL - Abnormal; Notable for the following components:   Calcium 8.7 (*)    All other components within normal limits  CBC - Abnormal; Notable for the following components:   WBC 12.8 (*)    Hemoglobin 11.9 (*)    MCV 77.6 (*)    MCH 23.3 (*)    RDW 17.7 (*)    Platelets 404 (*)    All other components within normal limits  LIPASE, BLOOD  URINALYSIS, ROUTINE W REFLEX MICROSCOPIC  RPR  HIV ANTIBODY (ROUTINE TESTING W REFLEX)  I-STAT BETA HCG BLOOD, ED ([facility], [facility], [facility] ONLY)  GC/CHLAMYDIA PROBE AMP ([place]) NOT AT [facility]    EKG None  Radiology US Transvaginal Non-ob  Result Date: 04/08/2018 CLINICAL DATA:  Initial evaluation for acute pelvic pain. EXAM: TRANSABDOMINAL AND TRANSVAGINAL ULTRASOUND OF PELVIS DOPPLER ULTRASOUND OF OVARIES TECHNIQUE: Both transabdominal and transvaginal ultrasound examinations of the pelvis were performed. Transabdominal technique was performed for global imaging of the pelvis including uterus, ovaries, adnexal regions, and pelvic cul-de-sac. It was necessary to proceed with endovaginal exam following the transabdominal exam to visualize the uterus, endometrium, and ovaries. Color and duplex Doppler ultrasound was utilized to evaluate blood flow to the ovaries. COMPARISON:  Prior CT from 03/02/2012 FINDINGS: Uterus Measurements: 8.2 x 4.1 x 4.6 cm = volume: 80.3 mL. No fibroids or other mass visualized. Endometrium Thickness: 10 mm.  No focal abnormality visualized. Right ovary Measurements: 6.0 x 4.4 x 3.9 cm = volume: 53.6 mL. 4.0 x 3.7 x 3.5 cm complex hypoechoic cyst with internal lace-like architecture, consistent with a hemorrhagic cyst. No internal solid component or vascularity. Additional 2.9 x 1.7 x 2.5 cm simple cyst, most consistent with a normal physiologic follicular cyst. Left ovary Measurements: 3.2 x 1.7 x 2.2 cm = volume: 6.3 mL. 1.5 x 1.2 x 1.2 cm hyperechoic mass, most consistent with a small dermoid, also seen on prior CT. Pulsed  Doppler evaluation of both ovaries demonstrates normal low-resistance arterial and venous waveforms. Other findings Small volume free physiologic fluid present within the pelvis. IMPRESSION: 1. 4 cm right ovarian hemorrhagic cyst with associated small volume free fluid within the pelvis. 2. No evidence for ovarian torsion or other complication. 3. 1.5 cm left ovarian dermoid, similar to prior CT from 2014. Electronically Signed   By: Rise Mu M.D.   On: 04/08/2018 00:11   US Pelvis Complete  Result Date: 04/08/2018 CLINICAL DATA:  Initial evaluation for acute pelvic pain. EXAM: TRANSABDOMINAL AND TRANSVAGINAL ULTRASOUND OF PELVIS DOPPLER ULTRASOUND OF OVARIES TECHNIQUE: Both transabdominal and transvaginal ultrasound examinations of the pelvis were performed.  Transabdominal technique was performed for global imaging of the pelvis including uterus, ovaries, adnexal regions, and pelvic cul-de-sac. It was necessary to proceed with endovaginal exam following the transabdominal exam to visualize the uterus, endometrium, and ovaries. Color and duplex Doppler ultrasound was utilized to evaluate blood flow to the ovaries. COMPARISON:  Prior CT from 03/02/2012 FINDINGS: Uterus Measurements: 8.2 x 4.1 x 4.6 cm = volume: 80.3 mL. No fibroids or other mass visualized. Endometrium Thickness: 10 mm.  No focal abnormality visualized. Right ovary Measurements: 6.0 x 4.4 x 3.9 cm = volume: 53.6 mL. 4.0 x 3.7 x 3.5 cm complex hypoechoic cyst with internal lace-like architecture, consistent with a hemorrhagic cyst. No internal solid component or vascularity. Additional 2.9 x 1.7 x 2.5 cm simple cyst, most consistent with a normal physiologic follicular cyst. Left ovary Measurements: 3.2 x 1.7 x 2.2 cm = volume: 6.3 mL. 1.5 x 1.2 x 1.2 cm hyperechoic mass, most consistent with a small dermoid, also seen on prior CT. Pulsed Doppler evaluation of both ovaries demonstrates normal low-resistance arterial and venous  waveforms. Other findings Small volume free physiologic fluid present within the pelvis. IMPRESSION: 1. 4 cm right ovarian hemorrhagic cyst with associated small volume free fluid within the pelvis. 2. No evidence for ovarian torsion or other complication. 3. 1.5 cm left ovarian dermoid, similar to prior CT from 2014. Electronically Signed   By: Rise Mu M.D.   On: 04/08/2018 00:11   [place] Art/ven Flow Abd Pelv Doppler  Result Date: 04/08/2018 CLINICAL DATA:  Initial evaluation for acute pelvic pain. EXAM: TRANSABDOMINAL AND TRANSVAGINAL ULTRASOUND OF PELVIS DOPPLER ULTRASOUND OF OVARIES TECHNIQUE: Both transabdominal and transvaginal ultrasound examinations of the pelvis were performed. Transabdominal technique was performed for global imaging of the pelvis including uterus, ovaries, adnexal regions, and pelvic cul-de-sac. It was necessary to proceed with endovaginal exam following the transabdominal exam to visualize the uterus, endometrium, and ovaries. Color and duplex Doppler ultrasound was utilized to evaluate blood flow to the ovaries. COMPARISON:  Prior CT from 03/02/2012 FINDINGS: Uterus Measurements: 8.2 x 4.1 x 4.6 cm = volume: 80.3 mL. No fibroids or other mass visualized. Endometrium Thickness: 10 mm.  No focal abnormality visualized. Right ovary Measurements: 6.0 x 4.4 x 3.9 cm = volume: 53.6 mL. 4.0 x 3.7 x 3.5 cm complex hypoechoic cyst with internal lace-like architecture, consistent with a hemorrhagic cyst. No internal solid component or vascularity. Additional 2.9 x 1.7 x 2.5 cm simple cyst, most consistent with a normal physiologic follicular cyst. Left ovary Measurements: 3.2 x 1.7 x 2.2 cm = volume: 6.3 mL. 1.5 x 1.2 x 1.2 cm hyperechoic mass, most consistent with a small dermoid, also seen on prior CT. Pulsed Doppler evaluation of both ovaries demonstrates normal low-resistance arterial and venous waveforms. Other findings Small volume free physiologic fluid present within the  pelvis. IMPRESSION: 1. 4 cm right ovarian hemorrhagic cyst with associated small volume free fluid within the pelvis. 2. No evidence for ovarian torsion or other complication. 3. 1.5 cm left ovarian dermoid, similar to prior CT from 2014. Electronically Signed   By: Rise Mu M.D.   On: 04/08/2018 00:11    Procedures Procedures (including critical care time)  Medications Ordered in ED Medications  sodium chloride flush (NS) 0.9 % injection 3 mL (has no administration in time range)  ketorolac (TORADOL) 15 MG/ML injection 15 mg (15 mg Intravenous Given 04/07/18 2242)     Initial Impression / Assessment and Plan / ED Course  I  have reviewed the triage vital signs and the nursing notes.  Pertinent labs & imaging results that were available during my care of the patient were reviewed by me and considered in my medical decision making (see chart for details).       Patient with history of ovarian cyst here for evaluation of lower abdominal pain as well as vaginally discharge. She does have tenderness on examination without peritoneal findings. What prep is significant for clue cells consistent with bacterial vaginosis. Pelvic ultrasound demonstrates ovarian cyst, no evidence of torsion. Current presentation is not consistent with tube ovarian abscess or PID, appendicitis. Discussed with patient home care for BV, ovarian cyst. Discussed outpatient follow-up as well as return precautions. Final Clinical Impressions(s) / ED Diagnoses   Final diagnoses:  Hemorrhagic cyst of right ovary  BV (bacterial vaginosis)    ED Discharge Orders    None       Tilden Fossa, MD 04/08/18 0020

## 2018-04-08 LAB — RPR: RPR Ser Ql: NONREACTIVE

## 2018-04-08 LAB — HIV ANTIBODY (ROUTINE TESTING W REFLEX): HIV SCREEN 4TH GENERATION: NONREACTIVE

## 2018-04-08 MED ORDER — METRONIDAZOLE 500 MG PO TABS: 500.0000 mg | ORAL_TABLET | Freq: Two times a day (BID) | ORAL | 0 refills | Status: DC

## 2018-04-08 MED ORDER — IBUPROFEN 800 MG PO TABS: 800.0000 mg | ORAL_TABLET | Freq: Three times a day (TID) | ORAL | 0 refills | Status: DC | PRN

## 2018-04-08 NOTE — Discharge Instructions (Signed)
You have a 4 cm hemorrhagic cyst on your right ovary that is causing your pain.  Get rechecked immediately if you develop fevers, worsening pain or new concerning symptoms.

## 2018-04-09 LAB — GC/CHLAMYDIA PROBE AMP (~~LOC~~) NOT AT ARMC
Chlamydia: POSITIVE — AB
Neisseria Gonorrhea: NEGATIVE

## 2019-08-30 ENCOUNTER — Ambulatory Visit (HOSPITAL_COMMUNITY)
Admission: EM | Admit: 2019-08-30 | Discharge: 2019-08-30 | Disposition: A | Discharge status: Home / Self Care | Payer: Medicaid Other | Attending: Emergency Medicine | Admitting: Emergency Medicine

## 2019-08-30 ENCOUNTER — Other Ambulatory Visit: Payer: Self-pay

## 2019-08-30 ENCOUNTER — Encounter (HOSPITAL_COMMUNITY): Payer: Self-pay

## 2019-08-30 DIAGNOSIS — Z20822 Contact with and (suspected) exposure to covid-19: Secondary | ICD-10-CM | POA: Insufficient documentation

## 2019-08-30 DIAGNOSIS — K047 Periapical abscess without sinus: Secondary | ICD-10-CM | POA: Diagnosis present

## 2019-08-30 DIAGNOSIS — J069 Acute upper respiratory infection, unspecified: Secondary | ICD-10-CM | POA: Insufficient documentation

## 2019-08-30 MED ORDER — FLUTICASONE PROPIONATE 50 MCG/ACT NA SUSP: 1.0000 | Freq: Every day | NASAL | 0 refills | Status: AC

## 2019-08-30 MED ORDER — IBUPROFEN 800 MG PO TABS: 800.0000 mg | ORAL_TABLET | Freq: Three times a day (TID) | ORAL | 0 refills | Status: AC

## 2019-08-30 MED ORDER — AMOXICILLIN 500 MG PO CAPS: 500.0000 mg | ORAL_CAPSULE | Freq: Two times a day (BID) | ORAL | 0 refills | Status: AC

## 2019-08-30 NOTE — ED Triage Notes (Signed)
Pt presents for COVID testing after exposure. States having headache since this morning.

## 2019-08-30 NOTE — Discharge Instructions (Addendum)
COVID test pending, monitor mychart for results Use anti-inflammatories for pain/swelling. You may take up to 800 mg Ibuprofen every 8 hours with food. You may supplement Ibuprofen with Tylenol [id number] mg every 8 hours. ' Flonase nasal spray 1-2 spray in each nostril daily to help with ear pressure Take daily allergy medicine- cetirizine/zyrtec or Claritin Amoxicillin twice daily x 1 week for dental infection  Follow up if not improving or worsening

## 2019-08-30 NOTE — ED Provider Notes (Signed)
[facility]    CSN: [id number] Arrival date & time: 08/30/19  1941      History   Chief Complaint Chief Complaint  Patient presents with  . Covid Exposure    HPI Rebecca Ortiz is a 29 y.o. female presenting today for concerns about Covid.  Patient reports that today she began to develop headache, sinus congestion, right ear pressure as well as a mild cough.  She reports possible exposure to Covid recently through someone who currently has a pending Covid test.  She denies any chest pain or shortness of breath.  Also reports concern over infection to her wisdom tooth.  Reports that it is cracked and she has had increased pain and swelling around it of recently.  Denies fevers.  HPI  Past Medical History:  Diagnosis Date  . Bronchitis   . Pancreatitis     There are no problems to display for this patient.   Past Surgical History:  Procedure Laterality Date  . OVARIAN CYST SURGERY      OB History   No obstetric history on file.      Home Medications    Prior to Admission medications   Medication Sig Start Date End Date Taking? Authorizing Provider  amoxicillin (AMOXIL) 500 MG capsule Take 1 capsule (500 mg total) by mouth 2 (two) times daily for 7 days. 08/30/19 09/06/19  Casey Maxfield C, PA-C  fluticasone (FLONASE) 50 MCG/ACT nasal spray Place 1-2 sprays into both nostrils daily for 7 days. 08/30/19 09/06/19  Sariyah Corcino C, PA-C  ibuprofen (ADVIL) 800 MG tablet Take 1 tablet (800 mg total) by mouth 3 (three) times daily. 08/30/19   Emrick Hensch, Junius Creamer, PA-C    Family History History reviewed. No pertinent family history.  Social History Social History   Tobacco Use  . Smoking status: Never Smoker  . Smokeless tobacco: Never Used  Substance Use Topics  . Alcohol use: Yes    Comment: rarely  . Drug use: No     Allergies   Patient has no known allergies.   Review of Systems Review of Systems  Constitutional: Negative for activity change,  appetite change, chills, fatigue and fever.  HENT: Positive for dental problem, ear pain and rhinorrhea. Negative for congestion, sinus pressure, sore throat and trouble swallowing.   Eyes: Negative for discharge and redness.  Respiratory: Positive for cough. Negative for chest tightness and shortness of breath.   Cardiovascular: Negative for chest pain.  Gastrointestinal: Negative for abdominal pain, diarrhea, nausea and vomiting.  Musculoskeletal: Negative for myalgias.  Skin: Negative for rash.  Neurological: Positive for headaches. Negative for dizziness and light-headedness.     Physical Exam Triage Vital Signs ED Triage Vitals  Enc Vitals Group     BP      Pulse      Resp      Temp      Temp src      SpO2      Weight      Height      Head Circumference      Peak Flow      Pain Score      Pain Loc      Pain Edu?      Excl. in GC?    No data found.  Updated Vital Signs BP 114/75 (BP Location: Left Arm)   Pulse 83   Temp 98.8 F (37.1 C) (Oral)   Resp 16   SpO2 100%   Visual Acuity Right  Eye Distance:   Left Eye Distance:   Bilateral Distance:    Right Eye Near:   Left Eye Near:    Bilateral Near:     Physical Exam Vitals and nursing note reviewed.  Constitutional:      Appearance: She is well-developed.     Comments: No acute distress  HENT:     Head: Normocephalic and atraumatic.     Comments: No facial swelling    Ears:     Comments: Bilateral ears without tenderness to palpation of external auricle, tragus and mastoid, EAC's without erythema or swelling, TM's with good bony landmarks and cone of light. Non erythematous.     Nose: Nose normal.     Mouth/Throat:     Comments: Oral mucosa pink and moist, no tonsillar enlargement or exudate. Posterior pharynx patent and nonerythematous, no uvula deviation or swelling. Normal phonation. Eyes:     Conjunctiva/sclera: Conjunctivae normal.  Cardiovascular:     Rate and Rhythm: Normal rate.    Pulmonary:     Effort: Pulmonary effort is normal. No respiratory distress.     Comments: Breathing comfortably at rest, CTABL, no wheezing, rales or other adventitious sounds auscultated Abdominal:     General: There is no distension.  Musculoskeletal:        General: Normal range of motion.     Cervical back: Neck supple.  Skin:    General: Skin is warm and dry.  Neurological:     Mental Status: She is alert and oriented to person, place, and time.      UC Treatments / Results  Labs (all labs ordered are listed, but only abnormal results are displayed) Labs Reviewed  SARS CORONAVIRUS 2 (TAT 6-24 HRS)    EKG   Radiology No results found.  Procedures Procedures (including critical care time)  Medications Ordered in UC Medications - No data to display  Initial Impression / Assessment and Plan / UC Course  I have reviewed the triage vital signs and the nursing notes.  Pertinent labs & imaging results that were available during my care of the patient were reviewed by me and considered in my medical decision making (see chart for details).     Covid test pending, suspect URI symptoms likely viral and recommending symptomatic and supportive care.  Rest and fluids.  We will go ahead and cover for dental infection and treat with amoxicillin.  Continue to monitor,Discussed strict return precautions. Patient verbalized understanding and is agreeable with plan.  Final Clinical Impressions(s) / UC Diagnoses   Final diagnoses:  Dental infection  Viral URI  Exposure to COVID-19 virus     Discharge Instructions     COVID test pending, monitor mychart for results Use anti-inflammatories for pain/swelling. You may take up to 800 mg Ibuprofen every 8 hours with food. You may supplement Ibuprofen with Tylenol [phone] mg every 8 hours. ' Flonase nasal spray 1-2 spray in each nostril daily to help with ear pressure Take daily allergy medicine- cetirizine/zyrtec or  Claritin Amoxicillin twice daily x 1 week for dental infection  Follow up if not improving or worsening    ED Prescriptions    Medication Sig Dispense Auth. Provider   fluticasone (FLONASE) 50 MCG/ACT nasal spray Place 1-2 sprays into both nostrils daily for 7 days. 1 g Juliani Laduke C, PA-C   ibuprofen (ADVIL) 800 MG tablet Take 1 tablet (800 mg total) by mouth 3 (three) times daily. 21 tablet Larinda Herter C, PA-C   amoxicillin (AMOXIL)  500 MG capsule Take 1 capsule (500 mg total) by mouth 2 (two) times daily for 7 days. 14 capsule Felisha Claytor, [place] C, PA-C     PDMP not reviewed this encounter.   Lew Dawes, [place] 08/30/19 2034

## 2019-08-31 LAB — SARS CORONAVIRUS 2 (TAT 6-24 HRS): SARS Coronavirus 2: NEGATIVE
